# Patient Record
Sex: Female | Born: 1990 | Hispanic: Yes | Marital: Married | Smoking: Never smoker
Health system: Southern US, Community
[De-identification: ages and names within clinical notes are randomized; demographics above are authoritative.]

## PROBLEM LIST (undated history)

## (undated) DIAGNOSIS — D649 Anemia, unspecified: Secondary | ICD-10-CM

## (undated) DIAGNOSIS — O1490 Unspecified pre-eclampsia, unspecified trimester: Secondary | ICD-10-CM

## (undated) DIAGNOSIS — O139 Gestational [pregnancy-induced] hypertension without significant proteinuria, unspecified trimester: Secondary | ICD-10-CM

## (undated) DIAGNOSIS — Z87440 Personal history of urinary (tract) infections: Secondary | ICD-10-CM

## (undated) DIAGNOSIS — B999 Unspecified infectious disease: Secondary | ICD-10-CM

## (undated) DIAGNOSIS — T783XXA Angioneurotic edema, initial encounter: Secondary | ICD-10-CM

## (undated) HISTORY — DX: Personal history of urinary (tract) infections: Z87.440

## (undated) HISTORY — PX: INTRAUTERINE DEVICE (IUD) INSERTION: SHX5877

## (undated) HISTORY — DX: Anemia, unspecified: D64.9

## (undated) HISTORY — PX: APPENDECTOMY: SHX54

## (undated) HISTORY — DX: Angioneurotic edema, initial encounter: T78.3XXA

## (undated) HISTORY — PX: OVARIAN CYST REMOVAL: SHX89

## (undated) HISTORY — DX: Unspecified infectious disease: B99.9

---

## 2019-05-14 ENCOUNTER — Other Ambulatory Visit: Payer: Self-pay

## 2019-05-14 ENCOUNTER — Emergency Department
Admission: EM | Admit: 2019-05-14 | Discharge: 2019-05-15 | Disposition: A | Discharge status: Home / Self Care | Payer: Self-pay | Attending: Emergency Medicine | Admitting: Emergency Medicine

## 2019-05-14 ENCOUNTER — Emergency Department: Payer: Self-pay

## 2019-05-14 ENCOUNTER — Encounter: Payer: Self-pay | Admitting: Emergency Medicine

## 2019-05-14 DIAGNOSIS — Z3A01 Less than 8 weeks gestation of pregnancy: Secondary | ICD-10-CM | POA: Insufficient documentation

## 2019-05-14 DIAGNOSIS — R102 Pelvic and perineal pain: Secondary | ICD-10-CM | POA: Insufficient documentation

## 2019-05-14 DIAGNOSIS — O4690 Antepartum hemorrhage, unspecified, unspecified trimester: Secondary | ICD-10-CM

## 2019-05-14 DIAGNOSIS — O26851 Spotting complicating pregnancy, first trimester: Secondary | ICD-10-CM | POA: Insufficient documentation

## 2019-05-14 HISTORY — DX: Unspecified pre-eclampsia, unspecified trimester: O14.90

## 2019-05-14 LAB — LIPASE, BLOOD: Lipase: 21 U/L (ref 11–51)

## 2019-05-14 LAB — CBC
HCT: 36.9 % (ref 36.0–46.0)
Hemoglobin: 12.7 g/dL (ref 12.0–15.0)
MCH: 29.3 pg (ref 26.0–34.0)
MCHC: 34.4 g/dL (ref 30.0–36.0)
MCV: 85 fL (ref 80.0–100.0)
Platelets: 275 10*3/uL (ref 150–400)
RBC: 4.34 MIL/uL (ref 3.87–5.11)
RDW: 12.7 % (ref 11.5–15.5)
WBC: 8.4 10*3/uL (ref 4.0–10.5)
nRBC: 0 % (ref 0.0–0.2)

## 2019-05-14 LAB — COMPREHENSIVE METABOLIC PANEL
ALT: 33 U/L (ref 0–44)
AST: 27 U/L (ref 15–41)
Albumin: 3.8 g/dL (ref 3.5–5.0)
Alkaline Phosphatase: 44 U/L (ref 38–126)
Anion gap: 9 (ref 5–15)
BUN: 15 mg/dL (ref 6–20)
CO2: 23 mmol/L (ref 22–32)
Calcium: 9.1 mg/dL (ref 8.9–10.3)
Chloride: 104 mmol/L (ref 98–111)
Creatinine, Ser: 0.52 mg/dL (ref 0.44–1.00)
GFR calc Af Amer: >60 mL/min (ref >60)
GFR calc non Af Amer: >60 mL/min (ref >60)
Glucose, Bld: 97 mg/dL (ref 70–99)
Potassium: 3.5 mmol/L (ref 3.5–5.1)
Sodium: 136 mmol/L (ref 135–145)
Total Bilirubin: 0.4 mg/dL (ref 0.3–1.2)
Total Protein: 7.5 g/dL (ref 6.5–8.1)

## 2019-05-14 LAB — URINALYSIS, COMPLETE (UACMP) WITH MICROSCOPIC
Bacteria, UA: NONE SEEN
Bilirubin Urine: NEGATIVE
Glucose, UA: NEGATIVE mg/dL
Hgb urine dipstick: NEGATIVE
Ketones, ur: NEGATIVE mg/dL
Leukocytes,Ua: NEGATIVE
Nitrite: NEGATIVE
Protein, ur: NEGATIVE mg/dL
Specific Gravity, Urine: 1.028 (ref 1.005–1.030)
pH: 5 (ref 5.0–8.0)

## 2019-05-14 LAB — HCG, QUANTITATIVE, PREGNANCY: hCG, Beta Chain, Quant, S: 43713 m[IU]/mL — ABNORMAL HIGH (ref <5)

## 2019-05-14 LAB — POCT PREGNANCY, URINE: Preg Test, Ur: POSITIVE — AB

## 2019-05-14 LAB — ABO/RH: ABO/RH(D): O POS

## 2019-05-14 IMAGING — US US OB < 14 WEEKS - US OB TV
1 series · 14 of 28 positions shown · non-contrast
Comparison: None.

CLINICAL DATA: Pelvic pain and vaginal bleeding with positive
pregnancy test

EXAM:
OBSTETRIC <14 WK US AND TRANSVAGINAL OB US
TECHNIQUE: Both transabdominal and transvaginal ultrasound examinations were
performed for complete evaluation of the gestation as well as the
maternal uterus, adnexal regions, and pelvic cul-de-sac.
Transvaginal technique was performed to assess early pregnancy.

[Series 1: us ob less than 14 weeks with ob transvaginal · 14 of 98 slices shown]
[im 4/98]
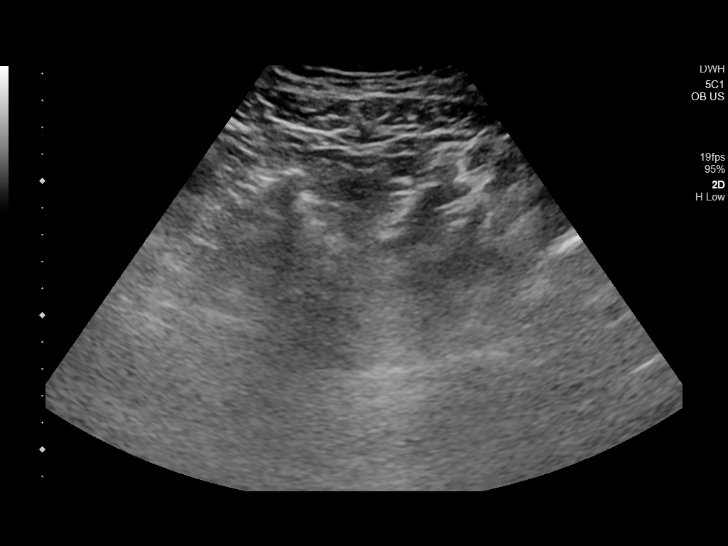
[im 11/98]
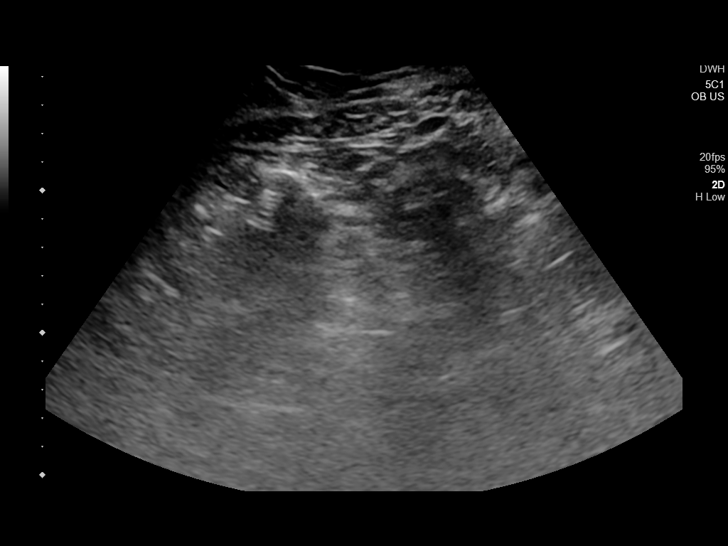
[im 18/98]
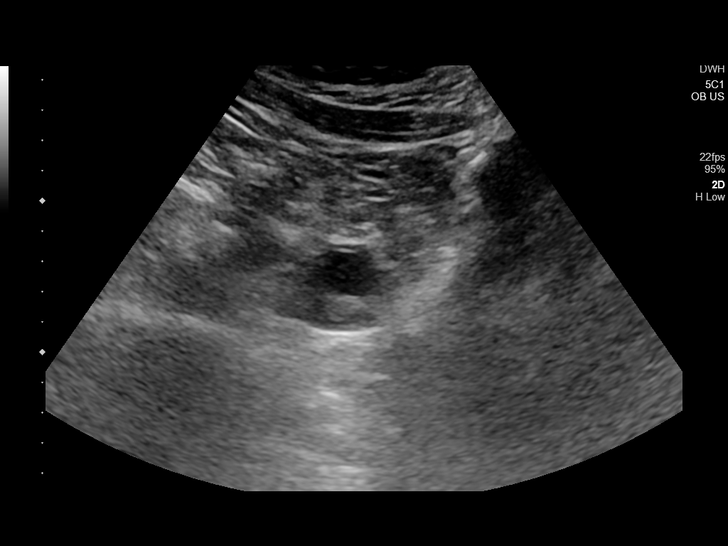
[im 26/98]
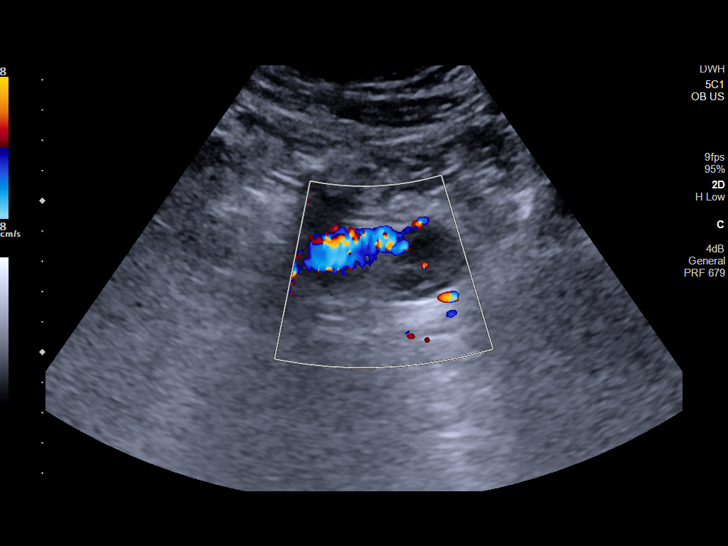
[im 33/98]
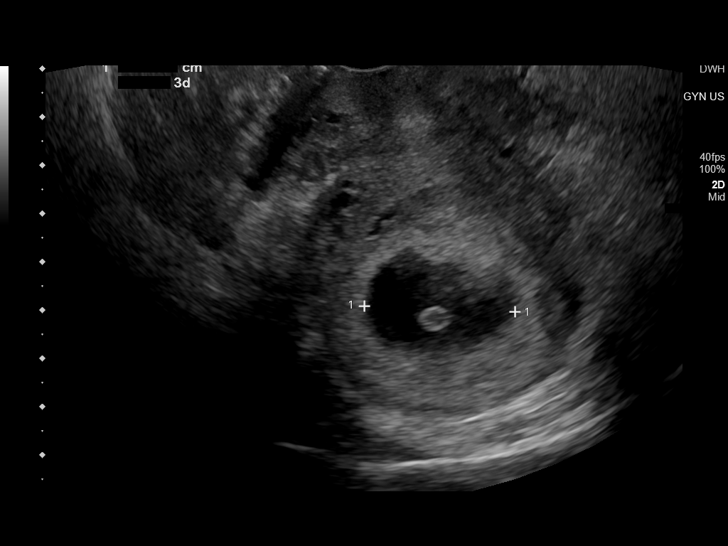
[im 40/98]
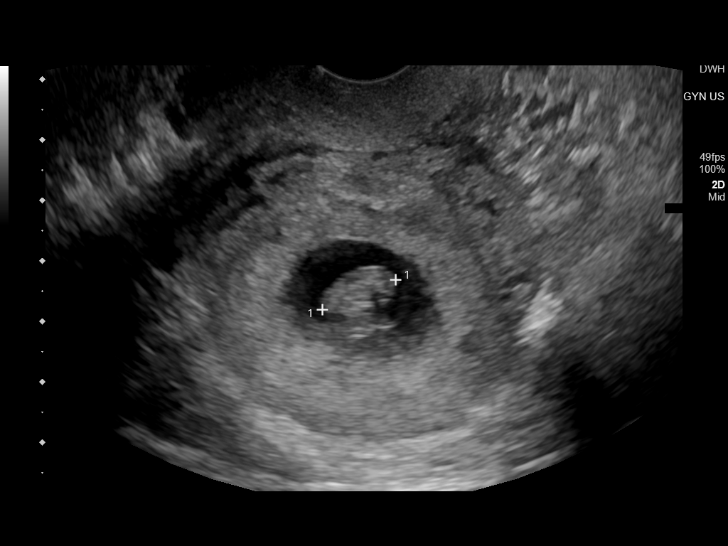
[im 47/98]
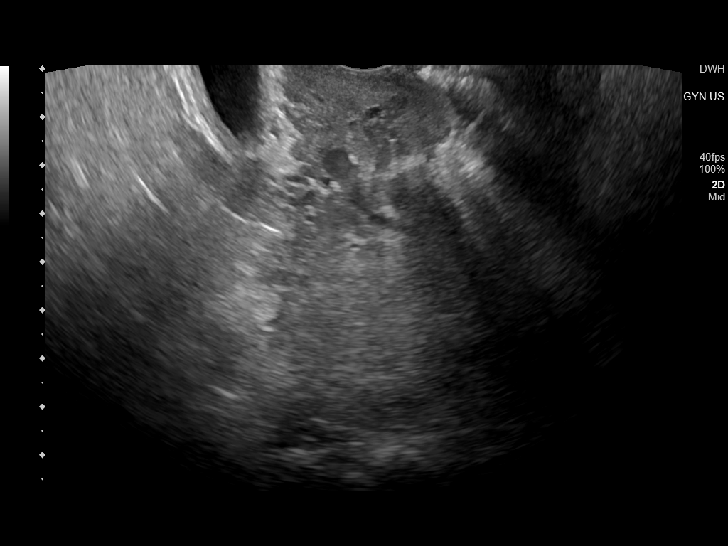
[im 54/98]
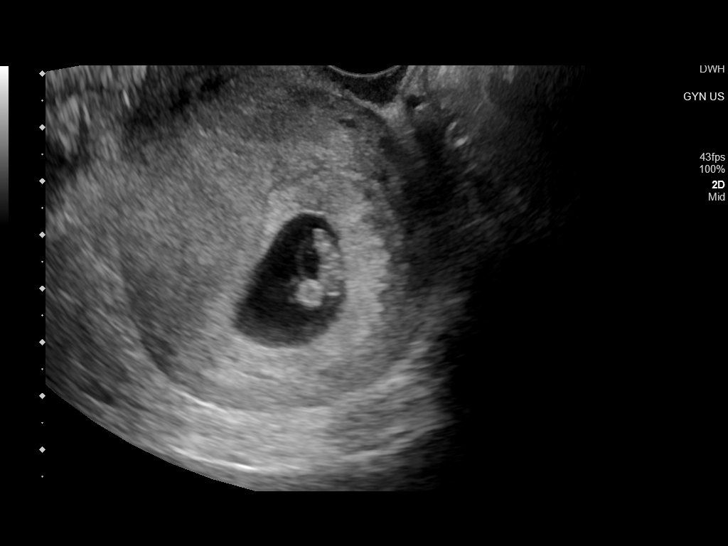
[im 62/98]
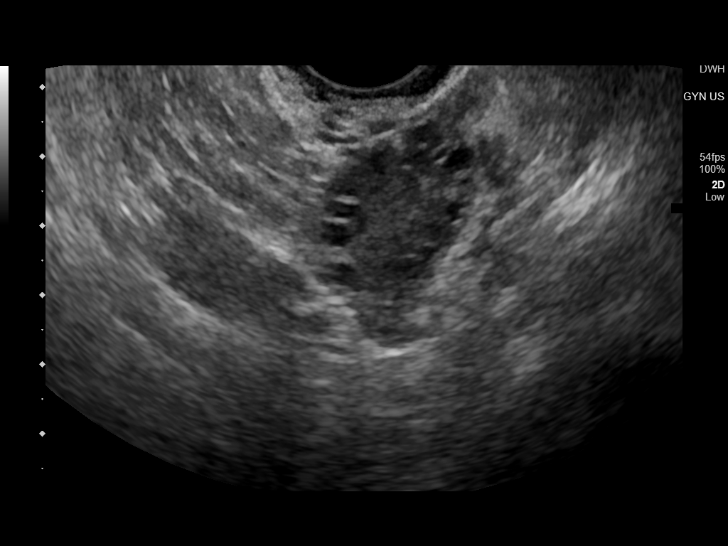
[im 69/98]
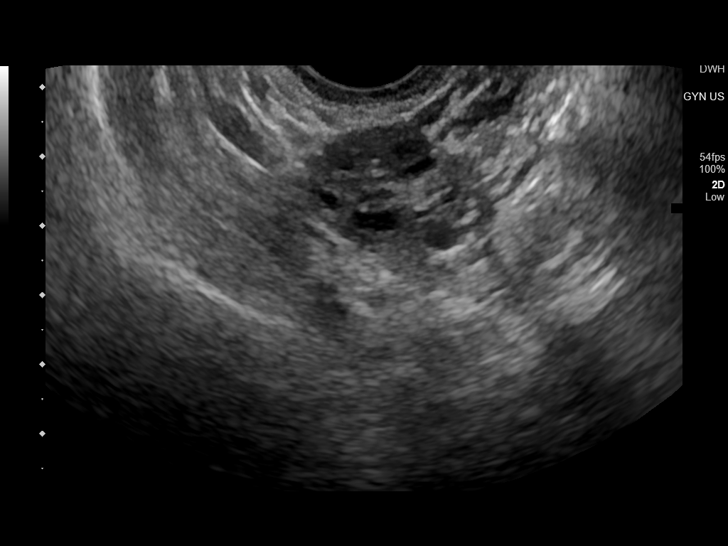
[im 76/98]
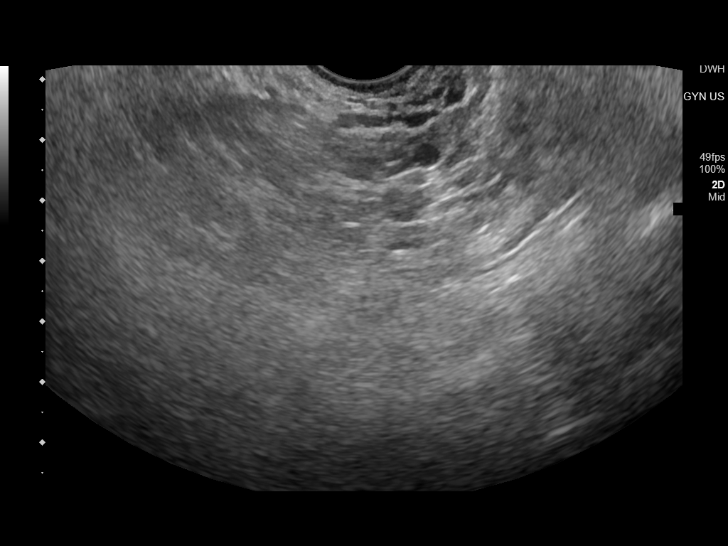
[im 83/98]
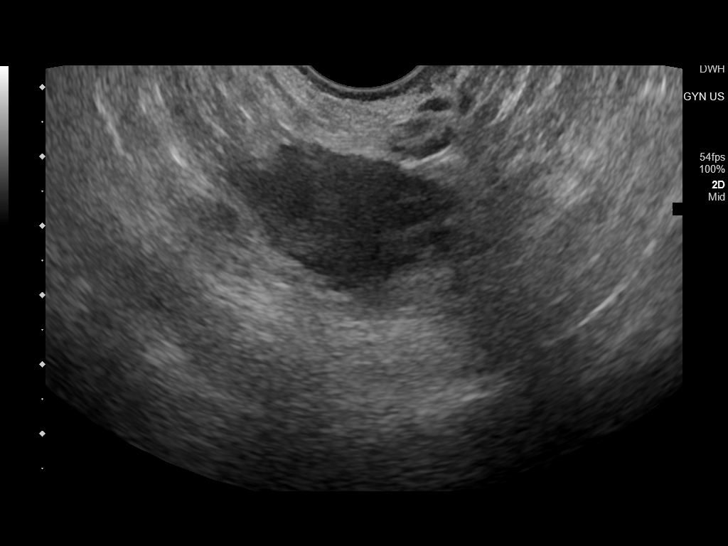
[im 90/98]
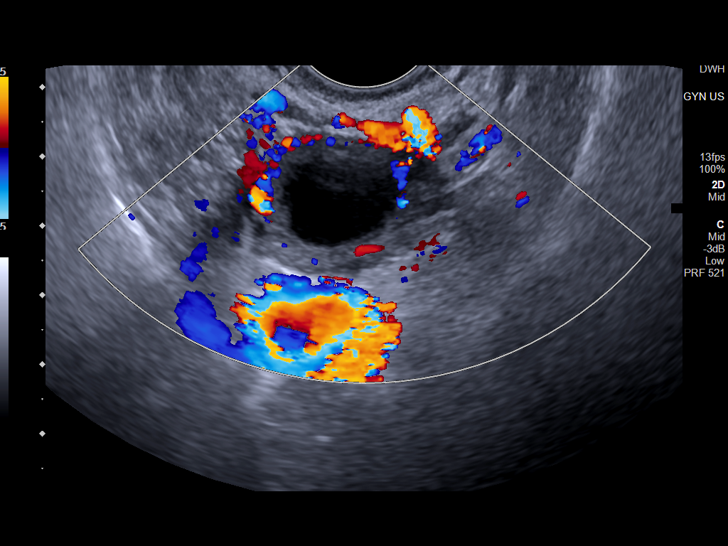
[im 98/98]
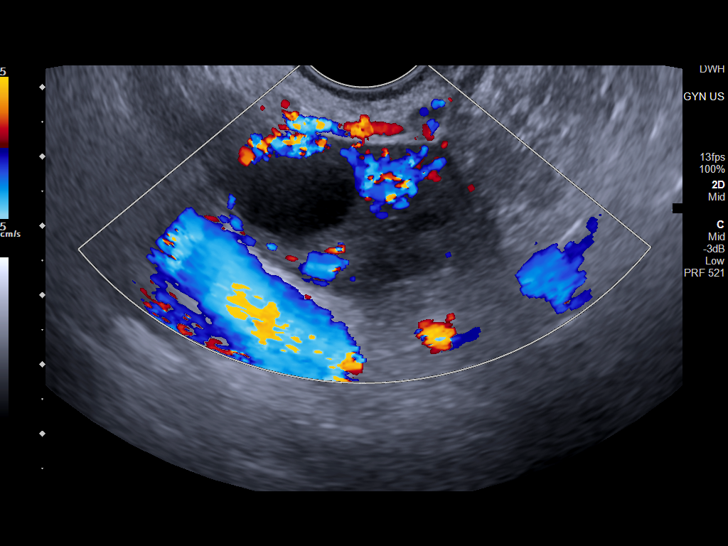

[14 of 28 positions shown; findings below may reference images not displayed]

FINDINGS: Intrauterine gestational sac: Present

Yolk sac:  Present

Embryo:  Present

Cardiac Activity: Present

Heart Rate: 147 bpm

CRL:  12.8 mm   7 w   4 d                  US EDC: [DATE]

Subchorionic hemorrhage:  None visualized.

Maternal uterus/adnexae: Within normal limits. Corpus luteum cyst is
noted on the left measuring 1.9 cm. Minimal free fluid is noted in
the pelvis likely physiologic in nature.
IMPRESSION: Single live intrauterine gestation at 7 weeks 4 days. No acute
abnormality noted.

## 2019-05-14 NOTE — ED Notes (Signed)
Pt to u/s via w/c accomp by u/s tech 

## 2019-05-14 NOTE — ED Notes (Signed)
Pt yet to return from [place].

## 2019-05-14 NOTE — ED Triage Notes (Addendum)
Pt arrived via POV with reports of back pain and pelvic pain x 2 weeks , pt states she took a pregnancy test 2 weeks ago that was positive.  Pt reports some vaginal bleeding with wiping.  G-2 P-1  Pt has hx of pre-eclampsia with previous pregnancy.  Pt requires spanish interpreter.

## 2019-05-14 NOTE — ED Provider Notes (Signed)
[facility] Emergency Department Provider Note  ____________________________________________   First MD Initiated Contact with Patient 05/14/19 2357     (approximate)  I have reviewed the triage vital signs and the nursing notes.   HISTORY  Chief Complaint Vaginal Bleeding, Pelvic Pain, and Back Pain  History obtained via [employer] Spanish interpretor  HPI Barbara Noble is a 29 y.o. female G2P1 with positive UPT at home who presents with a 2 week history of pelvic cramping. Reports light vaginal spotting during this time period as well; no bleeding currently. Unsure of dates but LMP around 4 months ago. Last sexual intercourse before onset of cramping. Denies vaginal discharge. Denies fever, cough, chest pain, sob, nausea, vomiting or dysuria.       Past Medical History:  Diagnosis Date  . Pre-eclampsia     There are no problems to display for this patient.    Prior to Admission medications   Not on File    Allergies Patient has no known allergies.  No family history on file.  Social History Social History   Tobacco Use  . Smoking status: Never Smoker  . Smokeless tobacco: Never Used  Substance Use Topics  . Alcohol use: Not Currently  . Drug use: Not on file    Review of Systems  Constitutional: No fever/chills Eyes: No visual changes. ENT: No sore throat. Cardiovascular: Denies chest pain. Respiratory: Denies shortness of breath. Gastrointestinal: Positive for pelvic cramping. No abdominal pain.  No nausea, no vomiting.  No diarrhea.  No constipation. Genitourinary: Negative for dysuria. Musculoskeletal: Negative for back pain. Skin: Negative for rash. Neurological: Negative for headaches, focal weakness or numbness.   ____________________________________________   PHYSICAL EXAM:  VITAL SIGNS: ED Triage Vitals  Enc Vitals Group     BP 05/14/19 2053 (!) 141/96     Pulse Rate 05/14/19 2053 (!) 108     Resp  05/14/19 2053 18     Temp 05/14/19 2053 98.2 F (36.8 C)     Temp Source 05/14/19 2053 Oral     SpO2 05/14/19 2053 97 %     Weight 05/14/19 2047 176 lb 5.9 oz (80 kg)     Height 05/14/19 2047 5' 10.08" (1.78 m)     Head Circumference --      Peak Flow --      Pain Score 05/14/19 2047 8     Pain Loc --      Pain Edu? --      Excl. in [place]? --     Constitutional: Alert and oriented. Well appearing and in no acute distress. Eyes: Conjunctivae are normal. PERRL. EOMI. Head: Atraumatic. Nose: No congestion/rhinnorhea. Mouth/Throat: Mucous membranes are moist.  Oropharynx non-erythematous. Neck: No stridor.   Cardiovascular: Normal rate, regular rhythm. Grossly normal heart sounds.  Good peripheral circulation. Respiratory: Normal respiratory effort.  No retractions. Lungs CTAB. Gastrointestinal: Soft and nontender to light or deep palpation. No distention. No abdominal bruits. No CVA tenderness. Musculoskeletal: No lower extremity tenderness nor edema.  No joint effusions. Neurologic:  Normal speech and language. No gross focal neurologic deficits are appreciated. No gait instability. Skin:  Skin is warm, dry and intact. No rash noted. Psychiatric: Mood and affect are normal. Speech and behavior are normal.  ____________________________________________   LABS (all labs ordered are listed, but only abnormal results are displayed)  Labs Reviewed  WET PREP, GENITAL - Abnormal; Notable for the following components:      Result Value   WBC,  Wet Prep HPF POC MODERATE (*)    All other components within normal limits  URINALYSIS, COMPLETE (UACMP) WITH MICROSCOPIC - Abnormal; Notable for the following components:   Color, Urine YELLOW (*)    APPearance CLEAR (*)    All other components within normal limits  HCG, QUANTITATIVE, PREGNANCY - Abnormal; Notable for the following components:   hCG, Beta Chain, Quant, S 42,595 (*)    All other components within normal limits  POCT PREGNANCY,  URINE - Abnormal; Notable for the following components:   Preg Test, Ur POSITIVE (*)    All other components within normal limits  CHLAMYDIA/NGC RT PCR ([facility] ONLY)  LIPASE, BLOOD  COMPREHENSIVE METABOLIC PANEL  CBC  ABO/RH   ____________________________________________  EKG  None ____________________________________________  RADIOLOGY  ED MD interpretation:  IUP [redacted]w[redacted]d; left corpus luteum cyst  Official radiology report(s): US OB LESS THAN 14 WEEKS WITH OB TRANSVAGINAL  Result Date: 05/15/2019 CLINICAL DATA:  Pelvic pain and vaginal bleeding with positive pregnancy test EXAM: OBSTETRIC <14 WK [place] AND TRANSVAGINAL OB US TECHNIQUE: Both transabdominal and transvaginal ultrasound examinations were performed for complete evaluation of the gestation as well as the maternal uterus, adnexal regions, and pelvic cul-de-sac. Transvaginal technique was performed to assess early pregnancy. COMPARISON:  None. FINDINGS: Intrauterine gestational sac: Present Yolk sac:  Present Embryo:  Present Cardiac Activity: Present Heart Rate: 147 bpm CRL:  12.8 mm   7 w   4 d                  [place] EDC: 12/27/2019 Subchorionic hemorrhage:  None visualized. Maternal uterus/adnexae: Within normal limits. Corpus luteum cyst is noted on the left measuring 1.9 cm. Minimal free fluid is noted in the pelvis likely physiologic in nature. IMPRESSION: Single live intrauterine gestation at 7 weeks 4 days. No acute abnormality noted. Electronically Signed   By: Alcide Clever M.D.   On: 05/15/2019 00:28    ____________________________________________   PROCEDURES  Procedure(s) performed (including Critical Care):  Procedures   ____________________________________________   INITIAL IMPRESSION / ASSESSMENT AND PLAN / ED COURSE  As part of my medical decision making, I reviewed the following data within the electronic MEDICAL RECORD NUMBER Nursing notes reviewed and incorporated, Labs reviewed, Old chart reviewed,  Radiograph reviewed and Notes from prior ED visits     [name] was evaluated in Emergency Department on 05/15/2019 for the symptoms described in the history of present illness. She was evaluated in the context of the global COVID-19 pandemic, which necessitated consideration that the patient might be at risk for infection with the SARS-CoV-2 virus that causes COVID-19. Institutional protocols and algorithms that pertain to the evaluation of patients at risk for COVID-19 are in a state of rapid change based on information released by regulatory bodies including the CDC and federal and state organizations. These policies and algorithms were followed during the patient's care in the ED.    29 year old G2P1 presenting with vaginal spotting last week and pelvic cramping. Differential diagnosis includes, but is not limited to, ovarian cyst, ovarian torsion, acute appendicitis, diverticulitis, urinary tract infection/pyelonephritis, endometriosis, bowel obstruction, colitis, renal colic, gastroenteritis, hernia, fibroids, endometriosis, pregnancy related pain including ectopic pregnancy, etc.  Laboratory and urinalysis results unremarkable. Patient will self-swab for wet prep and DNA. Awaiting [place] results.   Clinical Course as of May 14 120  Wed May 15, 2019  0120 Updated patient on [place] results. Strict return precautions given. Patient verbalizes understanding and agrees  with plan of care.   [JS]    Clinical Course User Index [JS] Irean Hong, MD     ____________________________________________   FINAL CLINICAL IMPRESSION(S) / ED DIAGNOSES  Final diagnoses:  Pelvic pain in female  Less than [redacted] weeks gestation of pregnancy     ED Discharge Orders    None       Note:  This document was prepared using Dragon voice recognition software and may include unintentional dictation errors.   Irean Hong, MD 05/15/19 [phone]

## 2019-05-15 ENCOUNTER — Encounter: Payer: Self-pay | Admitting: Radiology

## 2019-05-15 LAB — CHLAMYDIA/NGC RT PCR (ARMC ONLY)
Chlamydia Tr: NOT DETECTED
N gonorrhoeae: NOT DETECTED

## 2019-05-15 LAB — WET PREP, GENITAL
Clue Cells Wet Prep HPF POC: NONE SEEN
Sperm: NONE SEEN
Trich, Wet Prep: NONE SEEN
Yeast Wet Prep HPF POC: NONE SEEN

## 2019-05-15 NOTE — ED Notes (Signed)
Assessment done via interpreter. Pt states pain started 15 days ago with d/c. Blood has stopped. Pt last sexual encounter 20 days ago. G2 P1

## 2019-05-15 NOTE — Discharge Instructions (Addendum)
Practice pelvic rest until you see your doctor. Return to the ER for worsening symptoms, persistent vomiting, difficulty breathing or other concerns.

## 2019-07-30 ENCOUNTER — Encounter (HOSPITAL_COMMUNITY): Payer: Self-pay | Admitting: Emergency Medicine

## 2019-07-30 ENCOUNTER — Other Ambulatory Visit: Payer: Self-pay

## 2019-07-30 ENCOUNTER — Inpatient Hospital Stay (HOSPITAL_COMMUNITY)
Admission: AD | Admit: 2019-07-30 | Discharge: 2019-07-30 | Disposition: A | Discharge status: Home / Self Care | Payer: 59 | Attending: Family Medicine | Admitting: Family Medicine

## 2019-07-30 DIAGNOSIS — Z8759 Personal history of other complications of pregnancy, childbirth and the puerperium: Secondary | ICD-10-CM | POA: Insufficient documentation

## 2019-07-30 DIAGNOSIS — R519 Headache, unspecified: Secondary | ICD-10-CM | POA: Diagnosis not present

## 2019-07-30 DIAGNOSIS — Z7982 Long term (current) use of aspirin: Secondary | ICD-10-CM | POA: Diagnosis not present

## 2019-07-30 DIAGNOSIS — Z3A18 18 weeks gestation of pregnancy: Secondary | ICD-10-CM | POA: Diagnosis not present

## 2019-07-30 DIAGNOSIS — O26892 Other specified pregnancy related conditions, second trimester: Secondary | ICD-10-CM | POA: Diagnosis not present

## 2019-07-30 DIAGNOSIS — R102 Pelvic and perineal pain: Secondary | ICD-10-CM | POA: Insufficient documentation

## 2019-07-30 DIAGNOSIS — Z79899 Other long term (current) drug therapy: Secondary | ICD-10-CM | POA: Insufficient documentation

## 2019-07-30 HISTORY — DX: Gestational (pregnancy-induced) hypertension without significant proteinuria, unspecified trimester: O13.9

## 2019-07-30 LAB — URINALYSIS, ROUTINE W REFLEX MICROSCOPIC
Bilirubin Urine: NEGATIVE
Glucose, UA: NEGATIVE mg/dL
Hgb urine dipstick: NEGATIVE
Ketones, ur: NEGATIVE mg/dL
Nitrite: NEGATIVE
Protein, ur: 30 mg/dL — AB
Specific Gravity, Urine: 1.032 — ABNORMAL HIGH (ref 1.005–1.030)
pH: 5 (ref 5.0–8.0)

## 2019-07-30 MED ORDER — OXYCODONE HCL 5 MG PO TABS
5.0000 mg | ORAL_TABLET | Freq: Once | ORAL | Status: AC
  Administered 2019-07-30: 5 mg via ORAL
  Filled 2019-07-30: qty 1

## 2019-07-30 MED ORDER — ACETAMINOPHEN 500 MG PO TABS
1000.0000 mg | ORAL_TABLET | Freq: Once | ORAL | Status: AC
  Administered 2019-07-30: 1000 mg via ORAL
  Filled 2019-07-30: qty 2

## 2019-07-30 MED ORDER — CYCLOBENZAPRINE HCL 5 MG PO TABS
10.0000 mg | ORAL_TABLET | Freq: Once | ORAL | Status: AC
  Administered 2019-07-30: 10 mg via ORAL
  Filled 2019-07-30: qty 2

## 2019-07-30 MED ORDER — METOCLOPRAMIDE HCL 5 MG/ML IJ SOLN: 5.0000 mg | Freq: Once | INTRAMUSCULAR | Status: DC

## 2019-07-30 MED ORDER — LACTATED RINGERS IV SOLN: INTRAVENOUS | Status: DC

## 2019-07-30 MED ORDER — DEXAMETHASONE SODIUM PHOSPHATE 10 MG/ML IJ SOLN: 8.0000 mg | Freq: Once | INTRAMUSCULAR | Status: DC

## 2019-07-30 MED ORDER — METOCLOPRAMIDE HCL 10 MG PO TABS
5.0000 mg | ORAL_TABLET | Freq: Once | ORAL | Status: AC
  Administered 2019-07-30: 5 mg via ORAL
  Filled 2019-07-30: qty 1

## 2019-07-30 MED ORDER — DIPHENHYDRAMINE HCL 50 MG/ML IJ SOLN: 12.5000 mg | Freq: Once | INTRAMUSCULAR | Status: DC

## 2019-07-30 NOTE — Discharge Instructions (Signed)
Dolor de cabeza general sin causa General Headache Without Cause El dolor de cabeza es un dolor o malestar que se siente en la zona de la cabeza o del cuello. Hay muchas causas y tipos de dolores de cabeza. En algunos casos, es posible que no se encuentre la causa. Siga estas indicaciones en su casa: Controle su afeccin para detectar cualquier cambio. Infrmele a su mdico acerca de los cambios. Siga estos pasos para controlar su afeccin: Control del dolor      Tome los medicamentos de venta libre y los recetados solamente como se lo haya indicado el mdico.  Cuando sienta dolor de cabeza acustese en un cuarto oscuro y tranquilo.  Si se lo indican, aplquese hielo en la cabeza y en la zona del cuello: ? Ponga el hielo en una bolsa plstica. ? Coloque una toalla entre la piel y la bolsa. ? Coloque el hielo durante 20minutos, 2a3veces al da.  Si se lo indican, aplique calor en la zona afectada. Use la fuente de calor que el mdico le recomiende, como una compresa de calor hmedo o una almohadilla trmica. ? Coloque una toalla entre la piel y la fuente de calor. ? Aplique calor durante 20 a 30minutos. ? Retire la fuente de calor si la piel se pone de color rojo brillante. Esto es muy importante si no puede sentir dolor, calor o fro. Puede correr un riesgo mayor de sufrir quemaduras.  Mantenga las luces tenues si las luces brillantes le molestan o sus dolores de cabeza empeoran. Comida y bebida  Mantenga un horario para las comidas.  Si bebe alcohol: ? Limite la cantidad que bebe a lo siguiente:  De 0 a 1 medida por da para las mujeres.  De 0 a 2 medidas por da para los hombres. ? Est atento a la cantidad de alcohol que hay en las bebidas que toma. En [place], una medida equivale a una botella de cerveza de 12oz (355ml), un vaso de vino de 5oz (148ml) o un vaso de una bebida alcohlica de alta graduacin de 1oz (44ml).  Deje de tomar cafena o reduzca  la cantidad que consume. Indicaciones generales   Lleve un registro diario para averiguar si ciertas cosas provocan los dolores de cabeza. Registre, por ejemplo, lo siguiente: ? Lo que usted come y bebe. ? El tiempo que duerme. ? Algn cambio en su dieta o en los medicamentos.  Hgase masajes o pruebe otras formas de relajarse.  Limite el estrs.  Sintese con la espalda recta. No contraiga (tensione) los msculos.  No consuma ningn producto que contenga nicotina o tabaco. Estos incluyen los cigarrillos, el tabaco para mascar y los cigarrillos electrnicos. Si necesita ayuda para dejar de fumar, consulte al mdico.  Haga ejercicios con regularidad tal como se lo indic el mdico.  Duerma lo suficiente. Esto a menudo significa entre 7 y 9horas de sueo cada noche.  Concurra a todas las visitas de control como se lo haya indicado el mdico. Esto es importante. Comunquese con un mdico si:  Los medicamentos no logran aliviar los sntomas.  Tiene un dolor de cabeza que es diferente a los otros dolores de cabeza.  Tiene malestar estomacal (nuseas) o vomita.  Tiene fiebre. Solicite ayuda inmediatamente si:  El dolor de cabeza empeora rpidamente.  El dolor empeora despus de hacer mucha actividad fsica.  Sigue vomitando.  Presenta rigidez en el cuello.  Tiene dificultad para ver.  Tiene dificultad para hablar.  Siente dolor en el ojo   o en el odo.  Sus msculos estn dbiles, o pierde el control muscular.  Pierde el equilibrio o tiene problemas para caminar.  Siente que va a desvanecerse (perder el conocimiento) o se desmaya.  Est desorientado (confundido).  Tiene una convulsin. Resumen  El dolor de cabeza es un dolor o malestar que se siente en la zona de la cabeza o del cuello.  Hay muchas causas y tipos de dolores de cabeza. En algunos casos, es posible que no se encuentre la causa.  Lleve un diario como ayuda para determinar la causa de los dolores  de cabeza. Controle su afeccin para detectar cualquier cambio. Infrmele a su mdico acerca de los cambios.  Comunquese con un mdico si tiene un dolor de cabeza que es diferente de lo habitual o si el dolor de cabeza no se alivia con los medicamentos.  Solicite ayuda de inmediato si el dolor de cabeza es muy intenso, vomita, tiene dificultad para ver, pierde el equilibrio o tiene una convulsin. Esta informacin no tiene como fin reemplazar el consejo del mdico. Asegrese de hacerle al mdico cualquier pregunta que tenga. Document Revised: 10/04/2017 Document Reviewed: 10/04/2017 Elsevier Patient Education  2020 Elsevier Inc.  

## 2019-07-30 NOTE — MAU Provider Note (Addendum)
Chief Complaint: Pelvic Pain and Headache   First Provider Initiated Contact with Patient 07/30/19 1917     SUBJECTIVE HPI: Barbara Noble is a 29 y.o. G2P1001 at [redacted]w[redacted]d who presents to [facility] reporting headache & pelvic pain.  Reports headache intermittently for the last week. Gradual onset of sharp headache in frontal area. Has been taking tylenol (unsure of dosage) without relief. Sound makes headache worse. Occasionally feels nauseated with pain.  Also reports pelvic pain & right hip pain. Pain was constant yesterday. Worse when stretching or lying on her right side.  Denies fever/chills, dysuria, vaginal bleeding, or LOF.   Location: head Quality: sharp, aching Severity: 8/10 on pain scale Duration: 1 week Timing: comes & goes Modifying factors: worse with sounds Associated signs and symptoms: none  Past Medical History:  Diagnosis Date  . Pre-eclampsia   . Pregnancy induced hypertension    OB History  Gravida Para Term Preterm AB Living  2 1 1     1   SAB TAB Ectopic Multiple Live Births               # Outcome Date GA Lbr Len/2nd Weight Sex Delivery Anes PTL Lv  2 Current           1 Term 07/15/15     Vag-Forceps      Past Surgical History:  Procedure Laterality Date  . APPENDECTOMY    . OVARIAN CYST REMOVAL     Social History   Socioeconomic History  . Marital status: Married    Spouse name: Not on file  . Number of children: Not on file  . Years of education: Not on file  . Highest education level: Not on file  Occupational History  . Not on file  Tobacco Use  . Smoking status: Never Smoker  . Smokeless tobacco: Never Used  Vaping Use  . Vaping Use: Never used  Substance and Sexual Activity  . Alcohol use: Not Currently  . Drug use: Never  . Sexual activity: Yes  Other Topics Concern  . Not on file  Social History Narrative  . Not on file   Social Determinants of Health   Financial Resource Strain:   . Difficulty of Paying  Living Expenses:   Food Insecurity:   . Worried About 07/17/15 in the Last Year:   . Programme researcher, broadcasting/film/video in the Last Year:   Transportation Needs:   . Barista (Medical):   Freight forwarder Lack of Transportation (Non-Medical):   Physical Activity:   . Days of Exercise per Week:   . Minutes of Exercise per Session:   Stress:   . Feeling of Stress :   Social Connections:   . Frequency of Communication with Friends and Family:   . Frequency of Social Gatherings with Friends and Family:   . Attends Religious Services:   . Active Member of Clubs or Organizations:   . Attends [employer] Meetings:   Banker Marital Status:   Intimate Partner Violence:   . Fear of Current or Ex-Partner:   . Emotionally Abused:   [employer] Physically Abused:   . Sexually Abused:    Family History  Problem Relation Age of Onset  . Diabetes Mother   . Hypertension Mother   . Healthy Father    No current facility-administered medications on file prior to encounter.   Current Outpatient Medications on File Prior to Encounter  Medication Sig Dispense Refill  . aspirin 81 MG  EC tablet Take by mouth.    . Prenatal Vit-Fe Fumarate-FA (PRENATAL MULTIVITAMIN) TABS tablet Take 1 tablet by mouth daily at 12 noon.    Marland Kitchen acetaminophen (TYLENOL) 500 MG tablet Take by mouth.    . ondansetron (ZOFRAN-ODT) 4 MG disintegrating tablet      No Known Allergies  I have reviewed patient's Past Medical Hx, Surgical Hx, Family Hx, Social Hx, medications and allergies.   Review of Systems  Constitutional: Negative.   Eyes: Negative for photophobia, pain and visual disturbance.  Gastrointestinal: Negative.   Genitourinary: Positive for pelvic pain. Negative for dysuria, vaginal bleeding and vaginal discharge.  Neurological: Positive for headaches.    OBJECTIVE Patient Vitals for the past 24 hrs:  BP Temp Temp src Pulse Resp SpO2 Weight  07/30/19 1830 123/88 98.6 F (37 C) Oral (!) 107 20 -- --  07/30/19 1816 --  -- -- -- -- -- 88.1 kg  07/30/19 1547 139/65 98.8 F (37.1 C) Oral (!) 104 16 100 % --   Constitutional: Well-developed, well-nourished female in no acute distress.  Cardiovascular: normal rate & rhythm, no murmur Respiratory: normal rate and effort. Lung sounds clear throughout GI: Abd soft, non-tender, Pos BS x 4. No guarding or rebound tenderness MS: Extremities nontender, no edema, normal ROM Neurologic: Alert and oriented x 4.  GU:  Cervix closed/thick   LAB RESULTS Results for orders placed or performed during the hospital encounter of 07/30/19 (from the past 24 hour(s))  Urinalysis, Routine w reflex microscopic     Status: Abnormal   Collection Time: 07/30/19  7:11 PM  Result Value Ref Range   Color, Urine AMBER (A) YELLOW   APPearance CLOUDY (A) CLEAR   Specific Gravity, Urine 1.032 (H) 1.005 - 1.030   pH 5.0 5.0 - 8.0   Glucose, UA NEGATIVE NEGATIVE mg/dL   Hgb urine dipstick NEGATIVE NEGATIVE   Bilirubin Urine NEGATIVE NEGATIVE   Ketones, ur NEGATIVE NEGATIVE mg/dL   Protein, ur 30 (A) NEGATIVE mg/dL   Nitrite NEGATIVE NEGATIVE   Leukocytes,Ua MODERATE (A) NEGATIVE   RBC / HPF 0-5 0 - 5 RBC/hpf   WBC, UA 6-10 0 - 5 WBC/hpf   Bacteria, UA RARE (A) NONE SEEN   Squamous Epithelial / LPF 21-50 0 - 5   Mucus PRESENT    Ca Oxalate Crys, UA PRESENT     IMAGING No results found.  MAU COURSE Orders Placed This Encounter  Procedures  . Urinalysis, Routine w reflex microscopic   Meds ordered this encounter  Medications  . acetaminophen (TYLENOL) tablet 1,000 mg  . cyclobenzaprine (FLEXERIL) tablet 10 mg    MDM FHT present via doppler  Abdomen soft & non tender. Cervix closed/thick.  Discussed maternity support belt. Will give rx for belt & address for bio tech.   Tylenol & flexeril for headache. If helps will give rx for flexeril. If headaches continue or worse may consider appt with headache clinic at [facility]   Care turned over to [facility]  [facility] Jorje Guild, NP 07/30/2019  8:00 PM  -after administration of Flexeril and Tylenol, pt reports HA now still 8/10 -care transferred to Hansel Feinstein, CNM Clarisa Fling, NP  8:49 PM 07/30/2019  Assumed Care: Discussed option of treating this headache as a migraine Offered IVF, Reglan, Benadryl and Decadron Pt declined this plan because of the time involved Option of using Oxy IR and Reglan PO with po hydration given, she wants to try this  Feels much better  after treatment Wants to go home  Assessment & Plan A;  Single IUP at [redacted]w[redacted]d       Pelvic pain      Headache, likely migraine  P:  Discharge home      Precautions to return if headache returns, may need head CT      Followup in OB office Encouraged to return here or to other Urgent Care/ED if she develops worsening of symptoms, increase in pain, fever, or other concerning symptoms.    Aviva Signs, CNM

## 2019-07-30 NOTE — ED Triage Notes (Signed)
Pt presents with right-sided pelvic pain, reports she is approx. 5 months pregnant, has had no prenatal care, LMP 01/24/19. Also c/o headache, has been monitoring her BPs at home, states they fluctuate, hx pre-eclampsia with previous pregnancy.

## 2019-07-30 NOTE — ED Provider Notes (Signed)
I was asked by triage staff to evaluate this patient to see if appropriate for MAU transfer.  Chief Complaint: Pelvic pain, headache  HPI: Patient had ultrasound confirmed IUP on May 14, 2019.  By that ultrasound patient would be 18 weeks and 4 days pregnant today. Patient reports over the last 2-3 days she has developed right-sided pelvic pain.  Additionally she reports a mild persistent headache for the last 2 days which is not improved by Tylenol.  She reports symptoms feel similar to episode of preeclampsia she had with her first pregnancy.  ROS: + Pelvic pain, headache           -Fall/injury, fever/chills, vision changes, dizziness, nausea/vomiting, chest pain/shortness of breath, numbness/weakness, tingling or any additional concerns.  Physical Exam:   Gen: No distress  Neuro: Awake and Alert  Skin: Warm    Focused Exam: Abdomen nontender, Speech is clear and goal oriented, follows commands, Major Cranial nerves without deficit, no facial droop, Normal strength in upper and lower extremities bilaterally including dorsiflexion and plantar flexion, strong and equal grip strength, Sensation normal to light and sharp touch, Moves extremities without ataxia, coordination intact.   4:11 PM: Discussed case with MAU provider Erin.  Patient has been accepted to transfer to [facility].  Spanish video interpreter used during encounter.  Initiation of care has begun. The patient has been counseled on the process, plan, and necessity for staying for the completion/evaluation, and the remainder of the medical screening examination   Note: Portions of this report may have been transcribed using voice recognition software. Every effort was made to ensure accuracy; however, inadvertent computerized transcription errors may still be present.   Bill Salinas, PA-C 07/30/19 1619    Geoffery Lyons, MD 07/30/19 2259

## 2019-07-30 NOTE — MAU Note (Signed)
Presents with c/o pain in pelvis and right hip.  Reports pain inhibits her from laying  on her right side.  Also concerned because has H/A and BP's fluctuating up and down.  Reports has Hx of Pre Eclampsia.  Denies VB.

## 2019-08-01 ENCOUNTER — Encounter: Payer: Self-pay | Admitting: *Deleted

## 2019-08-08 ENCOUNTER — Encounter: Payer: 59 | Admitting: Obstetrics & Gynecology

## 2019-08-08 ENCOUNTER — Other Ambulatory Visit: Payer: Self-pay

## 2019-08-08 ENCOUNTER — Encounter: Payer: Self-pay | Admitting: Obstetrics & Gynecology

## 2019-08-08 NOTE — Progress Notes (Signed)
Patient ID: Barbara Noble, female   DOB: May 02, 1990, 29 y.o.   MRN: [id number] Patient was scheduled with Dr.Arnold today, she decided to cancel this appointment because she will be going to  -[facility] for her care, state it will be closer to her house.

## 2019-08-08 NOTE — Progress Notes (Signed)
Record abstracted per 08/07/19 phone interview Hal Hope, RN; Sharlette Dense, RN

## 2019-08-09 ENCOUNTER — Ambulatory Visit: Payer: Medicaid Other | Admitting: Family Medicine

## 2019-08-09 ENCOUNTER — Encounter: Payer: Self-pay | Admitting: Family Medicine

## 2019-08-09 VITALS — BP 125/86 | HR 97 | Temp 98.5°F | Wt 194.5 lb

## 2019-08-09 DIAGNOSIS — Z8742 Personal history of other diseases of the female genital tract: Secondary | ICD-10-CM | POA: Insufficient documentation

## 2019-08-09 DIAGNOSIS — O169 Unspecified maternal hypertension, unspecified trimester: Secondary | ICD-10-CM | POA: Diagnosis not present

## 2019-08-09 DIAGNOSIS — O0930 Supervision of pregnancy with insufficient antenatal care, unspecified trimester: Secondary | ICD-10-CM | POA: Diagnosis not present

## 2019-08-09 DIAGNOSIS — Z9889 Other specified postprocedural states: Secondary | ICD-10-CM

## 2019-08-09 DIAGNOSIS — Z8759 Personal history of other complications of pregnancy, childbirth and the puerperium: Secondary | ICD-10-CM | POA: Diagnosis not present

## 2019-08-09 DIAGNOSIS — Z3480 Encounter for supervision of other normal pregnancy, unspecified trimester: Secondary | ICD-10-CM

## 2019-08-09 LAB — URINALYSIS
Bilirubin, UA: NEGATIVE
Glucose, UA: NEGATIVE
Ketones, UA: NEGATIVE
Leukocytes,UA: NEGATIVE
Nitrite, UA: NEGATIVE
Protein,UA: NEGATIVE
RBC, UA: NEGATIVE
Specific Gravity, UA: 1.03 (ref 1.005–1.030)
Urobilinogen, Ur: 0.2 mg/dL (ref 0.2–1.0)
pH, UA: 5.5 (ref 5.0–7.5)

## 2019-08-09 NOTE — Progress Notes (Signed)
[facility] HEALTH DEPT [facility] [address] [place] [address] [phone]  INITIAL PRENATAL VISIT NOTE  Subjective:  Barbara Noble is a 29 y.o. G2P0101 at [redacted]w[redacted]d being seen today to start prenatal care at the [facility] Department.  She is currently monitored for the following issues for this high-risk pregnancy and has Supervision of other normal pregnancy, antepartum; History of pre-eclampsia; Elevated blood pressure affecting pregnancy, antepartum; History of removal of ovarian cyst; and Late prenatal care @20  wks on their problem list.   Pt presents for initial OB visit. She lives with husband, 31 yo daughter and husband's 4 friends. She moved here 8 months ago from [place]. She works part time in [place] and her husband works in Metall[employer]. She denies chronic medical issues. Taking PNV and tylenol (for HA) 1-2x/day. She is unsure of when her last pap was. She does not drink, smoke or use drugs. Her PHQ-9 score is 4, she declines counseling.  Physically she is feeling ok, though suffers from headaches for past several wks. Was seen in ER on 6/15 for HA, took oxycodone but not yet flexeril. States pain is entire front of head/forehead and/or top of head. Denies vision problems or neuro sxs. Sleeping helps. Has hx of migraines as adolescent. In last pregnancy had HAs occasionally, not this bad.   Her prior pregnancy was complicated by pre-eclampsia, dx at 6 months gestation, states she was prescribed oral BP medication at that time, then needed hospitalization until IOL at around 8 months. She states BP was normal after this pregnancy, based on readings from home BP cuff running 120s/80s. During this current pregnancy at around 12 wks she had elevated BP of 140s/80s at [facility] for which she was referred to [facility] (BP normal there) and began taking aspirin for pre-eclampsia prevention.     Contractions: Not  present. Vag. Bleeding: None.  Movement: Present. Denies leaking of fluid.   Indications for ASA therapy (per uptodate) One of the following: Previous pregnancy with preeclampsia, especially early onset and with an adverse outcome Yes Multifetal gestation No Chronic hypertension - unclear Type 1 or 2 diabetes mellitus No Chronic kidney disease No Autoimmune disease (antiphospholipid syndrome, systemic lupus erythematosus) No  Two or more of the following: Nulliparity No Obesity (body mass index >30 kg/m2) No Family history of preeclampsia in mother or sister No Age ?35 years No Sociodemographic characteristics (African American race, low socioeconomic level) Yes Personal risk factors (eg, previous pregnancy with low birth weight or small for gestational age infant, previous adverse pregnancy outcome [eg, stillbirth], interval >10 years between pregnancies) Yes   The following portions of the patient's history were reviewed and updated as appropriate: allergies, current medications, past family history, past medical history, past social history, past surgical history and problem list. Problem list updated.  Objective:   Vitals:   08/09/19 0845 08/09/19 1138  BP: 133/90 125/86  Pulse: 97   Temp: 98.5 F (36.9 C)   Weight: 194 lb 8 oz (88.2 kg)     Fetal Status: Fetal Heart Rate (bpm): 150 Fundal Height: 20 cm Movement: Present  Presentation: Undeterminable   Physical Exam Vitals and nursing note reviewed.  Constitutional:      General: She is not in acute distress.    Appearance: Normal appearance. She is well-developed.  HENT:     Head: Normocephalic and atraumatic.     Right Ear: External ear normal.     Left Ear:  External ear normal.     Nose: Nose normal. No congestion or rhinorrhea.     Mouth/Throat:     Lips: Pink.     Mouth: Mucous membranes are moist.     Dentition: Normal dentition. No dental caries.     Pharynx: Oropharynx is clear. Uvula midline.  Eyes:      General: No scleral icterus.    Conjunctiva/sclera: Conjunctivae normal.  Neck:     Thyroid: No thyroid mass or thyromegaly.  Cardiovascular:     Rate and Rhythm: Normal rate.     Pulses: Normal pulses.     Comments: Extremities are warm and well perfused Pulmonary:     Effort: Pulmonary effort is normal.     Breath sounds: Normal breath sounds.  Chest:     Breasts: Breasts are symmetrical.        Right: Normal. No mass, nipple discharge or skin change.        Left: Normal. No mass, nipple discharge or skin change.  Abdominal:     General: Abdomen is flat.     Palpations: Abdomen is soft.     Tenderness: There is no abdominal tenderness.     Comments: Gravid   Genitourinary:    General: Normal vulva.     Exam position: Lithotomy position.     Pubic Area: No rash.      Labia:        Right: No rash.        Left: No rash.      Vagina: Vaginal discharge (scant, white) present.     Cervix: No cervical motion tenderness or friability.     Uterus: Normal. Enlarged (Gravid 20 wksize). Not tender.      Adnexa: Right adnexa normal and left adnexa normal.     Rectum: Normal. No external hemorrhoid.  Musculoskeletal:     Right lower leg: No edema.     Left lower leg: No edema.  Lymphadenopathy:     Upper Body:     Right upper body: No axillary adenopathy.     Left upper body: No axillary adenopathy.  Skin:    General: Skin is warm.     Capillary Refill: Capillary refill takes less than 2 seconds.  Neurological:     Mental Status: She is alert.     Assessment and Plan:  Pregnancy: G2P0101 at [redacted]w[redacted]d  1. Supervision of other normal pregnancy, antepartum -Initial prenatal visit today.  -Pt would like quad genetic screening, getting today -Anatomy US referral placed today to [facility]. -Regarding headaches: checking hgb today, advised incr hydration, 2 ES tylenol prn, can try flexeril as prescribed by ER and can also try allergy medication such as zyrtec as she also endorses itchy  eyes. If HA severe, not relieved w/these efforts to let us know/return to ER for evaluation.  -PHQ-9 score is 4. Pt informed of counselor available if desired at any time, declines at this time. -Routine dental care recommended during pregnancy, handout of local dentists provided. -Reviewed risk factors and aspirin is indicated, pt agrees to re-start taking daily. -Pap done today - QUAD Screen [facility] Only - Prenatal Profile with Varicella(282020) - Urine Culture - HIV Antibody (routine testing w rflx) - HCV Ab w/Rflx to Verification - Chlamydia/GC NAA, Confirmation - Hemoglobinopathy evaluation -[id number] - Lead, blood (adult age 31 yrs or greater) - WET PREP FOR TRICH, YEAST, CLUE - Hemoglobin, venipuncture - Urinalysis (Urine Dip) - IGP, rfx Aptima HPV ASCU  2. Elevated blood pressure affecting  pregnancy, antepartum -Pt now has two reported elevated Bps during this pregnancy. First was "140s/80s" at around 12 wks at [facility] (unable to see this visit in EMR, though was documented as such in 06/19/10 [facility] visit) and second today at 20 wks was 133/90.  -As this puts her pregnancy at higher risk I will transfer her for remainder of her prenatal care to [facility]. Referral faxed today.  -In the meantime advised to continue checking BP at home and to go to ER if >140/90.  3. History of pre-eclampsia -Will get baseline UPCR and PIH labs.  -Pt taking 81mg  aspirin daily from 12-[redacted] wks gestation. Handout with instructions given.  -S/sx of preeclampsia discussed, advised to let know/to ER asap if present. - PIH Panel ([facility] [place]) - Protein / creatinine ratio, urine  (Spot)  4. Late prenatal care  5. History of removal of ovarian cyst     Discussed overview of care and coordination with inpatient delivery practices including [facility], [id number], [facility] and [facility].    Preterm labor symptoms and general obstetric precautions including but not limited to vaginal bleeding,  contractions, leaking of fluid and fetal movement were reviewed in detail with the patient.  Please refer to After Visit Summary for other counseling recommendations.   Return for transfer to [facility] for remainder of prenatal care.  Future Appointments  Date Time Provider Department Center  09/06/2019  8:20 AM AC-MH PROVIDER AC-MAT None    09/08/2019, PA-C

## 2019-08-09 NOTE — Progress Notes (Signed)
In for initial MH visit at 20 wks 0days. Has PNV, denies flu shot. Desires QUAD screen today. Initial labs to be drawn today. Sharlyne Pacas, RN   Wet prep reviewed, no tx indicated. Informed [facility] will call with ultrasound appointment. Referred to COVID vaccine clinc for vaccine today. Sharlyne Pacas, RN   [place] Referral faxed to [facility], confirmation received. Sharlyne Pacas, RN

## 2019-08-10 LAB — CBC/D/PLT+RPR+RH+ABO+RUB AB...
Antibody Screen: NEGATIVE
Basophils Absolute: 0 10*3/uL (ref 0.0–0.2)
Basos: 1 %
EOS (ABSOLUTE): 0.2 10*3/uL (ref 0.0–0.4)
Eos: 3 %
Hematocrit: 37.2 % (ref 34.0–46.6)
Hemoglobin: 12.5 g/dL (ref 11.1–15.9)
Hepatitis B Surface Ag: NEGATIVE
Immature Grans (Abs): 0 10*3/uL (ref 0.0–0.1)
Immature Granulocytes: 0 %
Lymphocytes Absolute: 2 10*3/uL (ref 0.7–3.1)
Lymphs: 27 %
MCH: 29.3 pg (ref 26.6–33.0)
MCHC: 33.6 g/dL (ref 31.5–35.7)
MCV: 87 fL (ref 79–97)
Monocytes Absolute: 0.4 10*3/uL (ref 0.1–0.9)
Monocytes: 6 %
Neutrophils Absolute: 4.8 10*3/uL (ref 1.4–7.0)
Neutrophils: 63 %
Platelets: 271 10*3/uL (ref 150–450)
RBC: 4.26 x10E6/uL (ref 3.77–5.28)
RDW: 13.1 % (ref 11.7–15.4)
RPR Ser Ql: NONREACTIVE
Rh Factor: POSITIVE
Rubella Antibodies, IGG: <0.9 index — ABNORMAL LOW (ref >0.99)
Varicella zoster IgG: 1209 index (ref >165)
WBC: 7.5 10*3/uL (ref 3.4–10.8)

## 2019-08-10 LAB — AST+BUN+CREAT+LD+URIC A+HGB...
AST: 23 IU/L (ref 0–40)
BUN: 8 mg/dL (ref 6–20)
Creatinine, Ser: 0.46 mg/dL — ABNORMAL LOW (ref 0.57–1.00)
GFR calc Af Amer: 153 mL/min/{1.73_m2} (ref >59)
GFR calc non Af Amer: 133 mL/min/{1.73_m2} (ref >59)
Hematocrit: 36.6 % (ref 34.0–46.6)
Hemoglobin: 12.7 g/dL (ref 11.1–15.9)
LDH: 148 IU/L (ref 119–226)
Platelets: 283 10*3/uL (ref 150–450)
Uric Acid: 3 mg/dL (ref 2.6–6.2)

## 2019-08-10 LAB — PROTEIN / CREATININE RATIO, URINE
Creatinine, Urine: 177.9 mg/dL
Protein, Ur: 14.8 mg/dL
Protein/Creat Ratio: 83 mg/g creat (ref 0–200)

## 2019-08-10 LAB — HIV ANTIBODY (ROUTINE TESTING W REFLEX): HIV Screen 4th Generation wRfx: NONREACTIVE

## 2019-08-11 LAB — URINE CULTURE

## 2019-08-11 LAB — CHLAMYDIA/GC NAA, CONFIRMATION
Chlamydia trachomatis, NAA: NEGATIVE
Neisseria gonorrhoeae, NAA: NEGATIVE

## 2019-08-12 DIAGNOSIS — O09899 Supervision of other high risk pregnancies, unspecified trimester: Secondary | ICD-10-CM | POA: Insufficient documentation

## 2019-08-12 LAB — HGB FRACTIONATION CASCADE
Hgb A2: 2.7 % (ref 1.8–3.2)
Hgb A: 97.3 % (ref 96.4–98.8)
Hgb F: 0 % (ref 0.0–2.0)
Hgb S: 0 %

## 2019-08-12 LAB — LEAD, BLOOD (ADULT >= 16 YRS): Lead-Whole Blood: <1 ug/dL (ref 0–4)

## 2019-08-12 LAB — IGP, RFX APTIMA HPV ASCU: PAP Smear Comment: 0

## 2019-08-12 LAB — HCV AB W/RFLX TO VERIFICATION: HCV Ab: <0.1 s/co ratio (ref 0.0–0.9)

## 2019-08-12 LAB — HCV INTERPRETATION

## 2019-08-13 ENCOUNTER — Telehealth: Payer: Self-pay

## 2019-08-13 NOTE — Telephone Encounter (Signed)
Client in questioning [facility] transfer--per Maximiano Coss, PA note on 08/09/19 visit, discussed due to H/O severe pre-eclampsia and having 2 elevated BP visits so far in pregnancy, was deemed need High Risk MFM care.  Client agrees to transfer Sharlette Dense, RN

## 2019-08-14 LAB — WET PREP FOR TRICH, YEAST, CLUE
Trichomonas Exam: NEGATIVE
Yeast Exam: NEGATIVE

## 2019-08-14 LAB — HEMOGLOBIN, FINGERSTICK: Hemoglobin: 12.5 g/dL (ref 11.1–15.9)

## 2019-08-26 ENCOUNTER — Encounter: Payer: Self-pay | Admitting: *Deleted

## 2019-08-26 DIAGNOSIS — O09899 Supervision of other high risk pregnancies, unspecified trimester: Secondary | ICD-10-CM

## 2019-08-30 ENCOUNTER — Encounter: Payer: Self-pay | Admitting: Family Medicine

## 2019-09-02 ENCOUNTER — Encounter: Payer: Self-pay | Admitting: Family Medicine

## 2019-09-02 DIAGNOSIS — O359XX0 Maternal care for (suspected) fetal abnormality and damage, unspecified, not applicable or unspecified: Secondary | ICD-10-CM | POA: Insufficient documentation

## 2019-09-04 LAB — SPECIMEN STATUS REPORT

## 2019-09-04 LAB — HPV APTIMA: HPV Aptima: NEGATIVE

## 2019-09-06 ENCOUNTER — Ambulatory Visit: Payer: 59

## 2019-09-23 ENCOUNTER — Encounter: Payer: Self-pay | Admitting: Family Medicine

## 2019-09-23 NOTE — Progress Notes (Signed)
Pap from 08/09/19 reviewed again due to interval return of HPV results  Negative Cytology with negative High risk HPV. Next pap in 5 years. HM modifier updated and results routed to pap coordinator.   Patient transferred maternity care to [facility].

## 2020-06-08 ENCOUNTER — Ambulatory Visit (INDEPENDENT_AMBULATORY_CARE_PROVIDER_SITE_OTHER): Payer: 59 | Admitting: Family Medicine

## 2020-06-08 ENCOUNTER — Encounter: Payer: Self-pay | Admitting: Family Medicine

## 2020-06-08 ENCOUNTER — Other Ambulatory Visit: Payer: Self-pay

## 2020-06-08 VITALS — BP 110/80 | HR 95 | Temp 98.9°F | Resp 12 | Ht 70.0 in | Wt 204.6 lb

## 2020-06-08 DIAGNOSIS — M549 Dorsalgia, unspecified: Secondary | ICD-10-CM

## 2020-06-08 DIAGNOSIS — G44209 Tension-type headache, unspecified, not intractable: Secondary | ICD-10-CM

## 2020-06-08 DIAGNOSIS — G8929 Other chronic pain: Secondary | ICD-10-CM

## 2020-06-08 DIAGNOSIS — N938 Other specified abnormal uterine and vaginal bleeding: Secondary | ICD-10-CM | POA: Diagnosis not present

## 2020-06-08 DIAGNOSIS — M5441 Lumbago with sciatica, right side: Secondary | ICD-10-CM | POA: Diagnosis not present

## 2020-06-08 DIAGNOSIS — M2550 Pain in unspecified joint: Secondary | ICD-10-CM

## 2020-06-08 MED ORDER — AMITRIPTYLINE HCL 25 MG PO TABS: 25.0000 mg | ORAL_TABLET | Freq: Every day | ORAL | 1 refills | Status: DC

## 2020-06-08 NOTE — Patient Instructions (Addendum)
A few things to remember from today's visit:   Tension headache - Plan: amitriptyline (ELAVIL) 25 MG tablet  DUB (dysfunctional uterine bleeding)  Polyarthralgia  Chronic bilateral low back pain with right-sided sciatica - Plan: amitriptyline (ELAVIL) 25 MG tablet  If you need refills please call your pharmacy. Do not use My Chart to request refills or for acute issues that need immediate attention.   Amitriptyline 1/2 tab a la hora de la cama y aumente dosis en 2 semanas si lo tolera bien.  Cefalea tensional en los adultos Tension Headache, Adult La cefalea tensional es una sensacin de dolor o presin en la cabeza. Suele sentirse Intel frente y a los lados de la cabeza. Las cefaleas tensionales pueden durar de 30 minutos a [address]. Cules son las causas? Se desconoce la causa de esta afeccin. A veces, las cefaleas tensionales se producen por el estrs, la preocupacin (ansiedad) o la depresin. Otras cosas que pueden ser causarlas son las siguientes:  Alcohol.  Exceso de cafena o abstinencia de cafena.  Resfros, gripe o infecciones de los senos paranasales.  Problemas dentales. Estos pueden Arts administrator.  Cansancio.  Mantener la cabeza y el cuello en la misma posicin durante un perodo prolongado, por ejemplo, al usar la computadora.  Fumar.  Artritis en el cuello. Cules son los signos o los sntomas?  Sensacin de presin alrededor de la cabeza.  Un dolor sordo en la cabeza.  Dolor sobre la frente y los lados de la cabeza.  Dolor a la [employer] de la cabeza, del cuello y de los hombros. Cmo se trata? Esta afeccin puede tratarse con cambios en el estilo de vida y medicamentos que ayudan a Paramedic los sntomas. Siga estas instrucciones en su casa: Control del dolor  Use los medicamentos de venta libre y los recetados solamente como se lo haya indicado el mdico.  Cuando tenga una cefalea, acustese en una  habitacin oscura y silenciosa.  Si se lo indican, pngase hielo en la cabeza y el cuello. Para hacer esto: ? Ponga el hielo en una bolsa plstica. ? Coloque una [employer] piel y Copy. ? Aplique el hielo durante , 2 o 3veces por da. ? Retire el hielo si la piel se le pone de color rojo brillante. Esto es Intel. Si no puede sentir dolor, calor o fro, tiene un mayor riesgo de que se dae la zona.  Si se lo indican, aplquese calor en la nuca. Hgalo con la frecuencia que le haya indicado el mdico. Use la fuente de calor que el mdico le recomiende, como una compresa de calor hmedo o una almohadilla trmica. ? Coloque una [employer] piel y la fuente de Airline pilot. ? Aplique calor durante 20 a . ? Retire la fuente de calor si la piel se le pone de color rojo brillante. Esto es [employer]. Si no puede sentir dolor, calor o fro, tiene un mayor riesgo de [place]. Comida y bebida  Mantenga un horario para las comidas.  Si bebe alcohol: ? Limite la cantidad que bebe a lo siguiente:  De 0 a 1 medida por da para las mujeres que no estn embarazadas.  De 0 a 2 medidas por da para los hombres. ? Sepa cunta cantidad de alcohol hay en las bebidas que toma. En los [address], una medida equivale a una botella de cerveza de 12oz ( ), un vaso de vino de 5oz ( ) o un vaso de  una bebida alcohlica de alta graduacin de 1oz (17ml).  Beba suficiente lquido para Radio producer pis (orina) de color amarillo plido.  No consuma mucha cafena ni deje de consumirla por completo. Estilo de vida  Duerma entre 7y 9horas todas las noches. O duerma la cantidad de [employer] indique el mdico.  A la hora de [place] las computadoras, los telfonos y las tabletas fuera del dormitorio.  Encuentre maneras de Museum/gallery exhibitions officer. Esto puede incluir: ? Musician. ? Respiracin profunda. ? Yoga. ? Chief Financial Officer. ? Tener pensamientos positivos.  Sintese con la espalda recta. Intentar relajar los msculos.  No fume ni consuma ningn producto que contenga nicotina o tabaco. Si necesita ayuda para dejar de consumir estos productos, consulte al mdico. Instrucciones generales  Evite las cosas que puedan provocar las cefaleas. Lleve un diario sobre las cefaleas para ver qu puede provocarlas. Registre, por ejemplo, lo siguiente: ? Lo que usted come y bebe. ? El tiempo que duerme. ? Algn cambio en su dieta o en los medicamentos.  Cumpla con todas las visitas de seguimiento.   Comunquese con un mdico si:  La cefalea no se alivia.  La cefalea regresa.  Tiene una cefalea y [employer]igroup sonidos, la luz o los olores.  Tiene ganas de vomitar o vomita.  Le duele el estmago. Solicite ayuda de inmediato si:  De repente siente un dolor de cabeza muy intenso junto con cualquiera de estas cosas: ? Rigidez en el cuello. ? Ganas de vomitar. ? Vmitos. ? Sentirse confundido (confuso). ? Sensacin de debilidad en una parte o un lado del cuerpo. ? Problemas para ver o hablar, o ambas cosas. ? Falta de aire. ? Erupcin cutnea. ? Sentirse muy somnoliento. ? Dolor en el ojo o el odo. ? Dificultad para caminar o mantener el equilibrio. ? Sensacin de que va a [place], o se desmaya. Resumen  La cefalea tensional es un dolor o una presin que se siente en la cabeza.  Las cefaleas tensionales pueden durar de 30 minutos a [address].  Los [employer] en el estilo de vida y los medicamentos pueden ayudar a Engineer, materials. Esta informacin no tiene Theme park manager el consejo del mdico. Asegrese de hacerle al mdico cualquier pregunta que tenga. Document Revised: 12/10/2019 Document Reviewed: 12/10/2019 Elsevier Patient Education  2021 Elsevier Inc.   Please be sure medication list is accurate. If a new problem present, please set up appointment sooner than planned  today.

## 2020-06-08 NOTE — Progress Notes (Addendum)
HPI:   Barbara Noble is a 30 y.o. female, who is here today with her husband and 2 children to establish care.  Former PCP: N/A Last preventive routine visit: Over a year ago.  Chronic medical problems: Hyperlipidemia, migraines, gestational diabetes, and preeclampsia.   Concerns today: Headaches, lower back pain, vaginal spotting, and joint pain.  Over 6 months of bilateral lower back pain radiated to right lower extremity. Negative for numbness and tingling. Negative for saddle anesthesia and bladder/bowel dysfunction. No hx of trauma. Negative for fever,changes in appetite, night sweats,abdominal pain,or changes in bowel habits.  Stress incontinence, exacerbated by coughing and sneezing. Urinary urgency. Problem has been going on for months, since pregnancy. Negative for dysuria, gross hematuria, or decreased urine output.  Knee crepitus and pain + ankle pain. Negative for family history of rheumatoid arthritis. She has not noted joint edema or erythema. Pain with movement and certain activities. Alleviated by rest. She has not tried OTC medication.  For the past 15 days she has had intermittent vaginal bleeding, blood noted on tissue after urination. Pelvic cramps, similar to those she had with menses before. No associated abdominal pain,N/V,dispareunia,or vaginal discharge.  Nexplanon placed after delivering her last child, 11/2019. She wonders if implant is causing headaches.  Lab Results  Component Value Date   WBC 7.5 08/09/2019   HGB 12.5 08/09/2019   HGB 12.7 08/09/2019   HCT 37.2 08/09/2019   HCT 36.6 08/09/2019   MCV 87 08/09/2019   PLT 271 08/09/2019   PLT 283 08/09/2019    For the past 5 to 6 months she has had parietal, frontal, and temporal headache. Daily, dull, 9/10.  She has not identified exacerbating or alleviating factors.  Occasionally associated nausea. No associated photophobia, phonophobia, or vomiting. Tylenol does not  help.  Review of Systems  Constitutional: Positive for fatigue. Negative for activity change.  HENT: Negative for mouth sores, nosebleeds and sore throat.   Eyes: Negative for pain and visual disturbance.  Respiratory: Negative for cough, shortness of breath and wheezing.   Cardiovascular: Negative for chest pain, palpitations and leg swelling.  Endocrine: Negative for cold intolerance and heat intolerance.  Genitourinary: Negative for flank pain and genital sores.  Skin: Negative for pallor and rash.  Allergic/Immunologic: Negative for environmental allergies.  Neurological: Negative for syncope, facial asymmetry, speech difficulty and weakness.  Hematological: Negative for adenopathy. Does not bruise/bleed easily.  Psychiatric/Behavioral: Negative for confusion. The patient is nervous/anxious.   Rest see pertinent positives and negatives per HPI.  Current Outpatient Medications on File Prior to Visit  Medication Sig Dispense Refill  . acetaminophen (TYLENOL) 500 MG tablet Take by mouth.    . Prenatal Vit-Fe Fumarate-FA (PRENATAL MULTIVITAMIN) TABS tablet Take 1 tablet by mouth daily at 12 noon.     No current facility-administered medications on file prior to visit.   Past Medical History:  Diagnosis Date  . Anemia    Hx when younger  . History of UTI    In first pregnancy  . Infection    History of intestinal infection  . Pre-eclampsia   . Pregnancy induced hypertension    2017-labor induced   No Known Allergies  Family History  Problem Relation Age of Onset  . Diabetes Mother   . Hypertension Mother   . Healthy Father   . Hypertension Maternal Grandmother   . Diabetes Maternal Grandmother     Social History   Socioeconomic History  . Marital status: Married  Spouse name: Carolos  . Number of children: 1  . Years of education: 83  . Highest education level: Not on file  Occupational History  . Not on file  Tobacco Use  . Smoking status: Never Smoker  .  Smokeless tobacco: Never Used  Vaping Use  . Vaping Use: Never used  Substance and Sexual Activity  . Alcohol use: Not Currently  . Drug use: Never  . Sexual activity: Yes    Birth control/protection: None  Other Topics Concern  . Not on file  Social History Narrative  . Not on file   Social Determinants of Health   Financial Resource Strain: Not on file  Food Insecurity: Food Insecurity Present  . Worried About Programme researcher, broadcasting/film/video in the Last Year: Sometimes true  . Ran Out of Food in the Last Year: Sometimes true  Transportation Needs: No Transportation Needs  . Lack of Transportation (Medical): No  . Lack of Transportation (Non-Medical): No  Physical Activity: Not on file  Stress: Not on file  Social Connections: Not on file   Vitals:   06/08/20 1548  BP: 110/80  Pulse: 95  Resp: 12  Temp: 98.9 F (37.2 C)  SpO2: 96%   Body mass index is 29.36 kg/m.  Physical Exam Vitals and nursing note reviewed.  Constitutional:      General: She is not in acute distress.    Appearance: She is well-developed.  HENT:     Head: Normocephalic and atraumatic.     Mouth/Throat:     Mouth: Mucous membranes are moist.     Pharynx: Oropharynx is clear.  Eyes:     Conjunctiva/sclera: Conjunctivae normal.     Pupils: Pupils are equal, round, and reactive to light.  Cardiovascular:     Rate and Rhythm: Normal rate and regular rhythm.     Pulses:          Dorsalis pedis pulses are 2+ on the right side and 2+ on the left side.     Heart sounds: No murmur heard.   Pulmonary:     Effort: Pulmonary effort is normal. No respiratory distress.     Breath sounds: Normal breath sounds.  Abdominal:     Palpations: Abdomen is soft. There is no hepatomegaly or mass.     Tenderness: There is no abdominal tenderness.  Genitourinary:    Comments: Deferred to gyn. Musculoskeletal:     Cervical back: Tenderness present. No bony tenderness.     Thoracic back: Tenderness present. No bony  tenderness.     Lumbar back: Tenderness present. No bony tenderness. Negative right straight leg raise test and negative left straight leg raise test.       Back:     Comments: No signs of synovitis.  Lymphadenopathy:     Cervical: No cervical adenopathy.  Skin:    General: Skin is warm.     Findings: No erythema or rash.  Neurological:     General: No focal deficit present.     Mental Status: She is alert and oriented to person, place, and time.     Cranial Nerves: No cranial nerve deficit.     Gait: Gait normal.     Deep Tendon Reflexes:     Reflex Scores:      Patellar reflexes are 2+ on the right side and 2+ on the left side. Psychiatric:        Mood and Affect: Mood is anxious.     Comments: Well groomed,  good eye contact.   ASSESSMENT AND PLAN:  Barbara Noble was seen today for establish care.  Diagnoses and all orders for this visit: Orders Placed This Encounter  Procedures  . Ambulatory referral to Physical Therapy  . Ambulatory referral to Gynecology    Tension headache We discussed possible etiologies. Examination and history do not suggest a serious process. She agrees with trial of amitriptyline, starting with 12.5 mg at bedtime, instructed to increase dose to 25 mg in 2 weeks if she is tolerating medication well. I do not think imaging is needed at this time. Instructed about warning signs. Follow-up in 6 weeks with headache diary.  -     amitriptyline (ELAVIL) 25 MG tablet; Take 1 tablet (25 mg total) by mouth at bedtime.  Polyarthralgia ?  OA vs rheumatologic disorder. Weight loss will help. We do not have lab service at this time. Next visit we will plan on checking CRP, RF, CCP.  Chronic bilateral low back pain with right-sided sciatica For now we will hold on lumbar imaging. Amitriptyline may help. We will follow in 6 weeks and determine the need for lumbar MRI. PT will be arranged.  -     amitriptyline (ELAVIL) 25 MG tablet; Take 1 tablet (25 mg  total) by mouth at bedtime.   DUB (dysfunctional uterine bleeding) Recommend arranging appointment with gynecologist, referral placed. It could be related with Nexplanon, other possible causes discussed. We will plan on checking CBC and TSH next visit.  Upper back pain Amitriptyline may help. PT will be arranged.   Return in about 6 weeks (around 07/20/2020) for HA,back pain.   Mance Vallejo G. [place], MD  [facility]. [place] office.   A few things to remember from today's visit:   Tension headache - Plan: amitriptyline (ELAVIL) 25 MG tablet  DUB (dysfunctional uterine bleeding)  Polyarthralgia  Chronic bilateral low back pain with right-sided sciatica - Plan: amitriptyline (ELAVIL) 25 MG tablet  If you need refills please call your pharmacy. Do not use My Chart to request refills or for acute issues that need immediate attention.   Amitriptyline 1/2 tab a la hora de la cama y aumente dosis en 2 semanas si lo tolera bien.  Cefalea tensional en los adultos Tension Headache, Adult La cefalea tensional es una sensacin de dolor o presin en la cabeza. Suele sentirse [employer] frente y a los lados de la cabeza. Las cefaleas tensionales pueden durar de 30 minutos a [address]. Cules son las causas? Se desconoce la causa de esta afeccin. A veces, las cefaleas tensionales se producen por el estrs, la preocupacin (ansiedad) o la depresin. Otras cosas que pueden ser causarlas son las siguientes:  Alcohol.  Exceso de cafena o abstinencia de cafena.  Resfros, gripe o infecciones de los senos paranasales.  Problemas dentales. Estos pueden Arts administrator.  Cansancio.  Mantener la cabeza y el cuello en la misma posicin durante un perodo prolongado, por ejemplo, al usar la computadora.  Fumar.  Artritis en el cuello. Cules son los signos o los sntomas?  Sensacin de presin alrededor de la cabeza.  Un dolor sordo en la cabeza.  Dolor  sobre la frente y los lados de la cabeza.  Dolor a la [employer] de la cabeza, del cuello y de los hombros. Cmo se trata? Esta afeccin puede tratarse con cambios en el estilo de vida y medicamentos que ayudan a Paramedic los sntomas. Siga estas instrucciones en su casa: Control del dolor  Use los medicamentos de venta libre y los recetados solamente como se lo haya indicado el mdico.  Cuando tenga una cefalea, acustese en una habitacin oscura y silenciosa.  Si se lo indican, pngase hielo en la cabeza y el cuello. Para hacer esto: ? Ponga el hielo en una bolsa plstica. ? Coloque una [employer] piel y Copy. ? Aplique el hielo durante , 2 o 3veces por da. ? Retire el hielo si la piel se le pone de color rojo brillante. Esto es [employer]. Si no puede sentir dolor, calor o fro, tiene un mayor riesgo de que se dae la zona.  Si se lo indican, aplquese calor en la nuca. Hgalo con la frecuencia que le haya indicado el mdico. Use la fuente de calor que el mdico le recomiende, como una compresa de calor hmedo o una almohadilla trmica. ? Coloque una [employer] piel y la fuente de Airline pilot. ? Aplique calor durante 20 a . ? Retire la fuente de calor si la piel se le pone de color rojo brillante. Esto es [employer]. Si no puede sentir dolor, calor o fro, tiene un mayor riesgo de [place]. Comida y bebida  Mantenga un horario para las comidas.  Si bebe alcohol: ? Limite la cantidad que bebe a lo siguiente:  De 0 a 1 medida por da para las mujeres que no estn embarazadas.  De 0 a 2 medidas por da para los hombres. ? Sepa cunta cantidad de alcohol hay en las bebidas que toma. En los [place], una medida equivale a una botella de cerveza de 12oz ( ), un vaso de vino de 5oz ( ) o un vaso de una bebida alcohlica de alta graduacin de 1oz (39ml).  Beba suficiente lquido para Radio producer pis (orina) de  color amarillo plido.  No consuma mucha cafena ni deje de consumirla por completo. Estilo de vida  Duerma entre 7y 9horas todas las noches. O duerma la cantidad de [employer] indique el mdico.  A la hora de [place] las computadoras, los telfonos y las tabletas fuera del dormitorio.  Encuentre maneras de Museum/gallery exhibitions officer. Esto puede incluir: ? Musician. ? Respiracin profunda. ? Yoga. ? Optometrist. ? Tener pensamientos positivos.  Sintese con la espalda recta. Intentar relajar los msculos.  No fume ni consuma ningn producto que contenga nicotina o tabaco. Si necesita ayuda para dejar de consumir estos productos, consulte al mdico. Instrucciones generales  Evite las cosas que puedan provocar las cefaleas. Lleve un diario sobre las cefaleas para ver qu puede provocarlas. Registre, por ejemplo, lo siguiente: ? Lo que usted come y bebe. ? El tiempo que duerme. ? Algn cambio en su dieta o en los medicamentos.  Cumpla con todas las visitas de seguimiento.   Comunquese con un mdico si:  La cefalea no se alivia.  La cefalea regresa.  Tiene una cefalea y [employer] sonidos, la luz o los olores.  Tiene ganas de vomitar o vomita.  Le duele el estmago. Solicite ayuda de inmediato si:  De repente siente un dolor de cabeza muy intenso junto con cualquiera de estas cosas: ? Rigidez en el cuello. ? Ganas de vomitar. ? Vmitos. ? Sentirse confundido (confuso). ? Sensacin de debilidad en una parte o un lado del cuerpo. ? Problemas para ver o hablar, o ambas cosas. ? Falta de aire. ? Erupcin cutnea. ? Sentirse muy somnoliento. ? Dolor en el ojo o el odo. ?  Dificultad para caminar o mantener el equilibrio. ? Sensacin de que va a [place], o se desmaya. Resumen  La cefalea tensional es un dolor o una presin que se siente en la cabeza.  Las cefaleas tensionales pueden durar de 30 minutos a [address].  Los [employer]  en el estilo de vida y los medicamentos pueden ayudar a Engineer, materials. Esta informacin no tiene Theme park manager el consejo del mdico. Asegrese de hacerle al mdico cualquier pregunta que tenga. Document Revised: 12/10/2019 Document Reviewed: 12/10/2019 Elsevier Patient Education  2021 Elsevier Inc.   Please be sure medication list is accurate. If a new problem present, please set up appointment sooner than planned today.

## 2020-08-07 ENCOUNTER — Other Ambulatory Visit: Payer: Self-pay

## 2020-08-07 ENCOUNTER — Ambulatory Visit (INDEPENDENT_AMBULATORY_CARE_PROVIDER_SITE_OTHER): Payer: 59 | Admitting: Family Medicine

## 2020-08-07 ENCOUNTER — Encounter: Payer: Self-pay | Admitting: Family Medicine

## 2020-08-07 VITALS — BP 100/60 | HR 83 | Temp 98.1°F | Resp 12 | Ht 70.0 in | Wt 200.4 lb

## 2020-08-07 DIAGNOSIS — G44209 Tension-type headache, unspecified, not intractable: Secondary | ICD-10-CM | POA: Diagnosis not present

## 2020-08-07 DIAGNOSIS — I889 Nonspecific lymphadenitis, unspecified: Secondary | ICD-10-CM

## 2020-08-07 DIAGNOSIS — E785 Hyperlipidemia, unspecified: Secondary | ICD-10-CM | POA: Diagnosis not present

## 2020-08-07 DIAGNOSIS — M5441 Lumbago with sciatica, right side: Secondary | ICD-10-CM

## 2020-08-07 DIAGNOSIS — H60502 Unspecified acute noninfective otitis externa, left ear: Secondary | ICD-10-CM

## 2020-08-07 DIAGNOSIS — Z131 Encounter for screening for diabetes mellitus: Secondary | ICD-10-CM | POA: Diagnosis not present

## 2020-08-07 DIAGNOSIS — M2550 Pain in unspecified joint: Secondary | ICD-10-CM

## 2020-08-07 DIAGNOSIS — G8929 Other chronic pain: Secondary | ICD-10-CM

## 2020-08-07 DIAGNOSIS — R2232 Localized swelling, mass and lump, left upper limb: Secondary | ICD-10-CM

## 2020-08-07 LAB — HEMOGLOBIN A1C: Hgb A1c MFr Bld: 5.7 % (ref 4.6–6.5)

## 2020-08-07 LAB — LIPID PANEL
Cholesterol: 190 mg/dL (ref 0–200)
HDL: 35 mg/dL — ABNORMAL LOW (ref >39.00)
LDL Cholesterol: 117 mg/dL — ABNORMAL HIGH (ref 0–99)
NonHDL: 154.56
Total CHOL/HDL Ratio: 5
Triglycerides: 188 mg/dL — ABNORMAL HIGH (ref 0.0–149.0)
VLDL: 37.6 mg/dL (ref 0.0–40.0)

## 2020-08-07 LAB — CBC
HCT: 40.1 % (ref 36.0–46.0)
Hemoglobin: 13.5 g/dL (ref 12.0–15.0)
MCHC: 33.6 g/dL (ref 30.0–36.0)
MCV: 83 fl (ref 78.0–100.0)
Platelets: 261 10*3/uL (ref 150.0–400.0)
RBC: 4.83 Mil/uL (ref 3.87–5.11)
RDW: 14 % (ref 11.5–15.5)
WBC: 5.2 10*3/uL (ref 4.0–10.5)

## 2020-08-07 LAB — COMPREHENSIVE METABOLIC PANEL
ALT: 24 U/L (ref 0–35)
AST: 16 U/L (ref 0–37)
Albumin: 4.3 g/dL (ref 3.5–5.2)
Alkaline Phosphatase: 72 U/L (ref 39–117)
BUN: 13 mg/dL (ref 6–23)
CO2: 26 mEq/L (ref 19–32)
Calcium: 9.3 mg/dL (ref 8.4–10.5)
Chloride: 104 mEq/L (ref 96–112)
Creatinine, Ser: 0.67 mg/dL (ref 0.40–1.20)
GFR: 115.91 mL/min (ref >60.00)
Glucose, Bld: 94 mg/dL (ref 70–99)
Potassium: 4.2 mEq/L (ref 3.5–5.1)
Sodium: 137 mEq/L (ref 135–145)
Total Bilirubin: 0.6 mg/dL (ref 0.2–1.2)
Total Protein: 7.4 g/dL (ref 6.0–8.3)

## 2020-08-07 LAB — SEDIMENTATION RATE: Sed Rate: 35 mm/hr — ABNORMAL HIGH (ref 0–20)

## 2020-08-07 LAB — TSH: TSH: 2.54 u[IU]/mL (ref 0.35–4.50)

## 2020-08-07 LAB — C-REACTIVE PROTEIN: CRP: <1 mg/dL (ref 0.5–20.0)

## 2020-08-07 NOTE — Patient Instructions (Addendum)
A few things to remember from today's visit:   Chronic bilateral low back pain with right-sided sciatica - Plan: Ambulatory referral to Orthopedic Surgery  Polyarthralgia - Plan: Comprehensive metabolic panel, CBC, TSH, Sedimentation rate, C-reactive protein, Rheumatoid factor, Cyclic citrul peptide antibody, IgG  Tension headache  Arm mass, left  Hyperlipidemia, unspecified hyperlipidemia type - Plan: Comprehensive metabolic panel, Lipid panel  Diabetes mellitus screening - Plan: Hemoglobin A1c  Do not use My Chart to request refills or for acute issues that need immediate attention.  Lipoma Lipoma  Un lipoma es un tumor no canceroso (benigno) formado por clulas de grasa. Es un tipo muy frecuente de [employer] tejidos blandos. Por lo general, los lipomas se encuentran debajo de la piel (subcutneos). Pueden aparecer en cualquier tejido del cuerpo que contenga grasa. Las zonas en las que los lipomas aparecen con mayor frecuencia incluyen la espalda, losbrazos, los hombros, las nalgas y los muslos. Los lipomas crecen lentamente y, en general, son indoloros. La mayora de loslipomas no causan problemas y no requieren TEFL teacher. Cules son las causas? Se desconoce la causa de esta afeccin. Qu incrementa el riesgo? Es ms probable que sufra esta afeccin si: Tiene entre 40 y 22 aos de edad. Tiene antecedentes familiares de lipomas. Cules son los signos o sntomas? Por lo general, el lipoma aparece como una pequea protuberancia redonda debajo de la piel. En la [employer], el bulto tiene las siguientes caractersticas: Se siente suave o elstico. No causa dolor ni otros sntomas. Sin embargo, si el lipoma se encuentra en un rea en la que hace presin sobrelos nervios, puede causar dolor u otros sntomas. Cmo se diagnostica? Por lo general, el lipoma puede diagnosticarse con un examen fsico. Tambin pueden hacerle estudios para confirmar el diagnstico y  Sales promotion account executive otras afecciones. Las pruebas pueden incluir las siguientes: Pruebas de diagnstico por imgenes, como una exploracin por tomografa computarizada (TC) o una resonancia magntica (RM). Extraccin de [place] de tejido para analizar con un microscopio (biopsia). Cmo se trata? El tratamiento de esta afeccin depende del tamao del lipoma y si causa algn sntoma. Los lipomas pequeos que no causan problemas no requieren tratamiento. Si un lipoma se agranda o causa problemas, puede realizarse [place] para extirpar el lipoma. Los lipomas tambin pueden extirparse para mejorar el aspecto. En la [employer], [place] procedimiento se realiza despus de aplicar un medicamento que adormece el rea (anestesia local). Pueden realizarse una liposuccin para reducir el tamao del lipoma antes de que se lo extraigan quirrgicamente, o bien se la puede realizar para extirpar el lipoma. Los lipomas se extirpan con este mtodo para limitar el tamao de la incisin y las cicatrices. Se introduce un tubo de liposuccin a travs de una pequea incisin en el lipoma y el contenido del lipoma se extrae a travs del tubo mediante succin. Siga estas indicaciones en casa: Controle el lipoma para detectar cambios. Concurra a todas las visitas de [address] se lo haya indicado el mdico. Esto es importante. Comunquese con un mdico si: El lipoma se agranda o se endurece. El lipoma comienza a causarle dolor, se enrojece o se hincha cada vez ms. Estos podran ser signos de infeccin o de una afeccin ms grave. Solicite ayuda de inmediato si: Siente hormigueo o adormecimiento en el rea cerca del lipoma. Esto podra indicar que el lipoma es lo que causa dao nervioso. Resumen Un lipoma es un tumor no canceroso formado por clulas de grasa. Katha Hamm[place]  de los lipomas no causan problemas y no requieren tratamiento. Si un lipoma se agranda o causa problemas, puede realizarse [place] para  extirpar el lipoma. Comunquese con un mdico si el lipoma se agranda o se endurece, o si empieza a dolor, se pone de color rojo o se hincha cada vez ms. El dolor, el enrojecimiento y la hinchazn podran ser signos de infeccin o de una afeccin ms grave. Esta informacin no tiene Theme park manager el consejo del mdico. Asegresede hacerle al mdico cualquier pregunta que tenga. Document Revised: 11/27/2018 Document Reviewed: 11/27/2018 Elsevier Patient Education  2022 Elsevier Inc.  Please be sure medication list is accurate. If a new problem present, please set up appointment sooner than planned today. Pare Amitriptyline. Mantenga la cita con el gynecologo. Cita con ortopedista pendiente.

## 2020-08-07 NOTE — Progress Notes (Addendum)
Chief Complaint  Patient presents with   Headache    Patient complains of headaches, x1 month,    Neck Pain    Patient complains of knot on left side of neck, x2 months, Patient reports slight ear pain when touching side of neck Patient tried Pain medication with little relief   HPI: [name] is a 30 y.o. female, who is here today complaining of persistent headache and left-sided tender gland as described above.  She was last seen on 06/08/20 because headache, thought it was more like tension headache. Amitriptyline 25 mg was recommended. She noted improvement for a few days but headache is back to her baseline. Parietal,frontal,and bitemporal headache. Not sure about exacerbating or alleviating factors. No associated photophobia,vomiting,or focal neurologist deficit. Sometimes nausea. She has had headaches since she had nexplanon placed.  Last visit labs were ordered, she was also concerned about LE joint pain: Knees and ankle. Exacerbated by repetitive activities that involve walking/standing.  Lower back pain radiated to RLE, problem has been going on for over 6 months. Bending aggravates pain. + Stiffness. Negative for saddle anesthesia or bowel/bladder dysfunction. PT did not help. Amitriptyline did not help with back pain. Problem is stable.  She has had dizziness, sense of movement, started 15 days ago,intermittent. Left earache, which has been going on intermittently for years. Noted again 4 days ago, she noted swollen and tender gland.  For a while she has noted "lump" in left arm.It is not tender, not sure about growth. She has not noted erythema or skin changes.  She would like labs done, concerned about diabetes and thyroid disease. Reporting mild HLD.  Review of Systems  Constitutional:  Positive for fatigue. Negative for activity change, appetite change and fever.  HENT:  Negative for hearing loss, mouth sores, nosebleeds and sore throat.   Eyes:   Negative for redness and visual disturbance.  Respiratory:  Negative for cough, shortness of breath and wheezing.   Cardiovascular:  Negative for chest pain, palpitations and leg swelling.  Gastrointestinal:  Negative for abdominal pain, nausea and vomiting.       Negative for changes in bowel habits.  Genitourinary:  Negative for decreased urine volume, dysuria and hematuria.  Musculoskeletal:  Negative for gait problem.  Neurological:  Negative for syncope, facial asymmetry and weakness.  Psychiatric/Behavioral:  Negative for confusion. The patient is nervous/anxious.   Rest see pertinent positives and negatives per HPI.  Current Outpatient Medications on File Prior to Visit  Medication Sig Dispense Refill   acetaminophen (TYLENOL) 500 MG tablet Take by mouth.     Prenatal Vit-Fe Fumarate-FA (PRENATAL MULTIVITAMIN) TABS tablet Take 1 tablet by mouth daily at 12 noon.     No current facility-administered medications on file prior to visit.   Past Medical History:  Diagnosis Date   Anemia    Hx when younger   History of UTI    In first pregnancy   Infection    History of intestinal infection   Pre-eclampsia    Pregnancy induced hypertension    2017-labor induced   No Known Allergies  Social History   Socioeconomic History   Marital status: Married    Spouse name: Sports coach   Number of children: 1   Years of education: 14   Highest education level: Not on file  Occupational History   Not on file  Tobacco Use   Smoking status: Never   Smokeless tobacco: Never  Vaping Use   Vaping Use: Never used  Substance and Sexual Activity   Alcohol use: Not Currently   Drug use: Never   Sexual activity: Yes    Birth control/protection: None  Other Topics Concern   Not on file  Social History Narrative   Not on file   Social Determinants of Health   Financial Resource Strain: Not on file  Food Insecurity: Not on file  Transportation Needs: Not on file  Physical Activity:  Not on file  Stress: Not on file  Social Connections: Not on file    Vitals:   08/07/20 1010  BP: 100/60  Pulse: 83  Resp: 12  Temp: 98.1 F (36.7 C)  SpO2: 96%   Body mass index is 28.75 kg/m.  Physical Exam Vitals and nursing note reviewed.  Constitutional:      General: She is not in acute distress.    Appearance: She is well-developed.  HENT:     Head: Normocephalic and atraumatic.     Right Ear: Tympanic membrane, ear canal and external ear normal.     Left Ear: Tympanic membrane and external ear normal.     Ears:     Comments: Left ear canal scaly/dry. There is no erythema or edema.    Mouth/Throat:     Mouth: Mucous membranes are moist.     Pharynx: Oropharynx is clear.  Eyes:     Conjunctiva/sclera: Conjunctivae normal.  Cardiovascular:     Rate and Rhythm: Normal rate and regular rhythm.     Pulses:          Dorsalis pedis pulses are 2+ on the right side and 2+ on the left side.     Heart sounds: No murmur heard. Pulmonary:     Effort: Pulmonary effort is normal. No respiratory distress.     Breath sounds: Normal breath sounds.  Abdominal:     Palpations: Abdomen is soft. There is no hepatomegaly or mass.     Tenderness: There is no abdominal tenderness.  Musculoskeletal:       Arms:     Lumbar back: Tenderness present. No bony tenderness. Negative right straight leg raise test and negative left straight leg raise test.       Back:  Lymphadenopathy:     Head:     Right side of head: No submandibular adenopathy.     Left side of head: Submandibular (1 cm, tender.) adenopathy present. No preauricular or posterior auricular adenopathy.     Cervical: No cervical adenopathy.  Skin:    General: Skin is warm.     Findings: No erythema or rash.  Neurological:     General: No focal deficit present.     Mental Status: She is alert and oriented to person, place, and time.     Cranial Nerves: No cranial nerve deficit.     Gait: Gait normal.  Psychiatric:      Comments: Well groomed, good eye contact.    ASSESSMENT AND PLAN:  Barbara Noble was seen today for headache and neck pain.  Diagnoses and all orders for this visit: Orders Placed This Encounter  Procedures   Comprehensive metabolic panel   CBC   TSH   Sedimentation rate   Lipid panel   Hemoglobin A1c   C-reactive protein   Rheumatoid factor   Cyclic citrul peptide antibody, IgG   Ambulatory referral to Orthopedic Surgery   Lab Results  Component Value Date   CREATININE 0.67 08/07/2020   BUN 13 08/07/2020   NA 137 08/07/2020   K 4.2  08/07/2020   CL 104 08/07/2020   CO2 26 08/07/2020   Lab Results  Component Value Date   ALT 24 08/07/2020   AST 16 08/07/2020   ALKPHOS 72 08/07/2020   BILITOT 0.6 08/07/2020   Lab Results  Component Value Date   TSH 2.54 08/07/2020   Lab Results  Component Value Date   HGBA1C 5.7 08/07/2020   Lab Results  Component Value Date   CHOL 190 08/07/2020   HDL 35.00 (L) 08/07/2020   LDLCALC 117 (H) 08/07/2020   TRIG 188.0 (H) 08/07/2020   CHOLHDL 5 08/07/2020   Lab Results  Component Value Date   WBC 5.2 08/07/2020   HGB 13.5 08/07/2020   HCT 40.1 08/07/2020   MCV 83.0 08/07/2020   PLT 261.0 08/07/2020   Lab Results  Component Value Date   CRP <1.0 08/07/2020   Chronic bilateral low back pain with right-sided sciatica Problem did not improve with PT nor amitriptyline. Ortho referral placed.  Polyarthralgia We discussed possible etiologies. Wt loss will help. Further recommendations according to lab results.  Tension headache Other possible etiologies discussed. Amitriptyline did not help much, so d/c. She has an appt with her gyn to have Nexplanon removed, which she thinks has been causing headaches. Instructed about warning signs.  Arm mass, left ? Lipoma. Reassured. Continue monitoring for now.  Hyperlipidemia, unspecified hyperlipidemia type Low fat diet recommended for now. Further recommendations  according to lipid panel results.  Diabetes mellitus screening -     Hemoglobin A1c  Acute otitis externa of left ear, unspecified type Otic drops to use bid for 7 days then prn. Monitor for new symptoms.  -     acetic acid-hydrocortisone (VOSOL-HC) OTIC solution; Place 3 drops into the left ear 2 (two) times daily as needed.  Submandibular lymphadenitis We discussed possible etiologies. Continue monitoring for now. If not resolved in 4-6 weeks, imaging may be necessary. Further recommendations according to lab results.   Return if symptoms worsen or fail to improve.   Oberon Hehir G. [place], MD  [facility]. [place] office.   A few things to remember from today's visit:   Chronic bilateral low back pain with right-sided sciatica - Plan: Ambulatory referral to Orthopedic Surgery  Polyarthralgia - Plan: Comprehensive metabolic panel, CBC, TSH, Sedimentation rate, C-reactive protein, Rheumatoid factor, Cyclic citrul peptide antibody, IgG  Tension headache  Arm mass, left  Hyperlipidemia, unspecified hyperlipidemia type - Plan: Comprehensive metabolic panel, Lipid panel  Diabetes mellitus screening - Plan: Hemoglobin A1c  Do not use My Chart to request refills or for acute issues that need immediate attention.  Lipoma Lipoma  Un lipoma es un tumor no canceroso (benigno) formado por clulas de grasa. Es un tipo muy frecuente de [employer] tejidos blandos. Por lo general, los lipomas se encuentran debajo de la piel (subcutneos). Pueden aparecer en cualquier tejido del cuerpo que contenga grasa. Las zonas en las que los lipomas aparecen con mayor frecuencia incluyen la espalda, losbrazos, los hombros, las nalgas y los muslos. Los lipomas crecen lentamente y, en general, son indoloros. La mayora de loslipomas no causan problemas y no requieren TEFL teacher. Cules son las causas? Se desconoce la causa de esta afeccin. Qu incrementa el riesgo? Es ms  probable que sufra esta afeccin si: Tiene entre 40 y 31 aos de edad. Tiene antecedentes familiares de lipomas. Cules son los signos o sntomas? Por lo general, el lipoma aparece como una pequea protuberancia redonda debajo de la piel. En la  mayora de los [place], el bulto tiene las siguientes caractersticas: Se siente suave o elstico. No causa dolor ni otros sntomas. Sin embargo, si el lipoma se encuentra en un rea en la que hace presin sobrelos nervios, puede causar dolor u otros sntomas. Cmo se diagnostica? Por lo general, el lipoma puede diagnosticarse con un examen fsico. Tambin pueden hacerle estudios para confirmar el diagnstico y Sales promotion account executivedescartar otras afecciones. Las pruebas pueden incluir las siguientes: Pruebas de diagnstico por imgenes, como una exploracin por tomografa computarizada (TC) o una resonancia magntica (RM). Extraccin de [place]una muestra de tejido para analizar con un microscopio (biopsia). Cmo se trata? El tratamiento de esta afeccin depende del tamao del lipoma y si causa algn sntoma. Los lipomas pequeos que no causan problemas no requieren tratamiento. Si un lipoma se agranda o causa problemas, puede realizarse [place] ciruga para extirpar el lipoma. Los lipomas tambin pueden extirparse para mejorar el aspecto. En la International Business Machinesmayora de los casos, Oregonel procedimiento se realiza despus de aplicar un medicamento que adormece el rea (anestesia local). Pueden realizarse una liposuccin para reducir el tamao del lipoma antes de que se lo extraigan quirrgicamente, o bien se la puede realizar para extirpar el lipoma. Los lipomas se extirpan con este mtodo para limitar el tamao de la incisin y las cicatrices. Se introduce un tubo de liposuccin a travs de una pequea incisin en el lipoma y el contenido del lipoma se extrae a travs del tubo mediante succin. Siga estas indicaciones en casa: Controle el lipoma para detectar cambios. Concurra a todas las visitas de  [address]segu[address] como se lo haya indicado el mdico. Esto es importante. Comunquese con un mdico si: El lipoma se agranda o se endurece. El lipoma comienza a causarle dolor, se enrojece o se hincha cada vez ms. Estos podran ser signos de infeccin o de una afeccin ms grave. Solicite ayuda de inmediato si: Siente hormigueo o adormecimiento en el rea cerca del lipoma. Esto podra indicar que el lipoma es lo que causa dao nervioso. Resumen Un lipoma es un tumor no canceroso formado por clulas de grasa. La mayora de los lipomas no causan problemas y no requieren TEFL teachertratamiento. Si un lipoma se agranda o causa problemas, puede realizarse [place] ciruga para extirpar el lipoma. Comunquese con un mdico si el lipoma se agranda o se endurece, o si empieza a dolor, se pone de color rojo o se hincha cada vez ms. El dolor, el enrojecimiento y la hinchazn podran ser signos de infeccin o de una afeccin ms grave. Esta informacin no tiene Theme park managercomo fin reemplazar el consejo del mdico. Asegresede hacerle al mdico cualquier pregunta que tenga. Document Revised: 11/27/2018 Document Reviewed: 11/27/2018 Elsevier Patient Education  2022 Elsevier Inc.  Please be sure medication list is accurate. If a new problem present, please set up appointment sooner than planned today. Pare Amitriptyline. Mantenga la cita con el gynecologo. Cita con ortopedista pendiente.

## 2020-08-09 ENCOUNTER — Encounter: Payer: Self-pay | Admitting: Family Medicine

## 2020-08-09 MED ORDER — HYDROCORTISONE-ACETIC ACID 1-2 % OT SOLN: 3.0000 [drp] | Freq: Two times a day (BID) | OTIC | 0 refills | Status: DC | PRN

## 2020-08-10 LAB — CYCLIC CITRUL PEPTIDE ANTIBODY, IGG: Cyclic Citrullin Peptide Ab: <16 UNITS

## 2020-08-10 LAB — RHEUMATOID FACTOR: Rheumatoid fact SerPl-aCnc: <14 IU/mL (ref <14)

## 2020-08-19 ENCOUNTER — Other Ambulatory Visit: Payer: Self-pay

## 2020-08-19 ENCOUNTER — Ambulatory Visit (INDEPENDENT_AMBULATORY_CARE_PROVIDER_SITE_OTHER): Payer: 59

## 2020-08-19 ENCOUNTER — Ambulatory Visit (INDEPENDENT_AMBULATORY_CARE_PROVIDER_SITE_OTHER): Payer: 59 | Admitting: Family Medicine

## 2020-08-19 ENCOUNTER — Encounter: Payer: Self-pay | Admitting: Family Medicine

## 2020-08-19 DIAGNOSIS — G8929 Other chronic pain: Secondary | ICD-10-CM

## 2020-08-19 DIAGNOSIS — M5441 Lumbago with sciatica, right side: Secondary | ICD-10-CM

## 2020-08-19 NOTE — Progress Notes (Signed)
Office Visit Note   Patient: Barbara Noble           Date of Birth: April 09, 1990           MRN: [id number] Visit Date: 08/19/2020 Requested by: [place], Betty G, MD [address] [place],  [place] [address] PCP: [place], Betty G, MD  Subjective: Chief Complaint  Patient presents with   Lower Back - Pain    Pain across lower back and down the posterior thigh. This started while she was pregnant with her 51 month old child, and has continued. Hurts to bend over at the waist. When she stands up after sitting a while, she will feel pain in the right posterior ankle.     HPI: She is here with low back and right leg pain.  Symptoms started a couple years ago with pain in the posterior hips and low back.  Then when she was pregnant with her now 46-month-old child, the pain got significantly worse with radiation down the right leg into the foot.  The pain is constant but is better when lying on her side.  Denies any bowel or bladder dysfunction, numbness or weakness in her leg.  She has not been to a physical therapist or chiropractor.  She does not take medication on a regular basis.  Interpreter was present today, and her husband and 2 children were also with her.  Patient did not have any troubles with her 50-year-old during that pregnancy.  She has a family history of scoliosis in her father.                ROS:   All other systems were reviewed and are negative.  Objective: Vital Signs: There were no vitals taken for this visit.  Physical Exam:  General:  Alert and oriented, in no acute distress. Pulm:  Breathing unlabored. Psy:  Normal mood, congruent affect. Skin: No rash Low back: I do not detect much in the way of scoliosis.  She is able to bend forward and touch her toes.  Straight leg raise is negative.  She is very tender to palpation in the midline at the L5-S1 level and over both sacroiliac joints.  Lower extremity strength and reflexes are normal.  She has good range of  motion of both hips with no pain on passive motion.    Imaging: XR Lumbar Spine 2-3 Views  Result Date: 08/19/2020 X-rays of the lumbar spine reveal no sign of scoliosis, no compression fractures, no sign of neoplasm.  Disc spaces are well-preserved.  She has sacralization of L5.  Hip joints look normal.   Assessment & Plan: Chronic low back pain with right-sided sciatica, concerning for lumbar disc protrusion. -We will try physical therapy.  She did not want any medications.  If she fails to improve, then MRI scan.     Procedures: No procedures performed        PMFS History: Patient Active Problem List   Diagnosis Date Noted   Fetal abnormality affecting management of mother, antepartum 09/02/2019   Rubella non-immune status, antepartum 08/12/2019   Supervision of other normal pregnancy, antepartum 08/09/2019   History of pre-eclampsia 08/09/2019   Elevated blood pressure affecting pregnancy, antepartum 08/09/2019   History of removal of ovarian cyst 08/09/2019   Late prenatal care @20  wks 08/09/2019   Past Medical History:  Diagnosis Date   Anemia    Hx when younger   History of UTI    In first pregnancy   Infection  History of intestinal infection   Pre-eclampsia    Pregnancy induced hypertension    2017-labor induced    Family History  Problem Relation Age of Onset   Diabetes Mother    Hypertension Mother    Healthy Father    Hypertension Maternal Grandmother    Diabetes Maternal Grandmother     Past Surgical History:  Procedure Laterality Date   APPENDECTOMY     OVARIAN CYST REMOVAL     Social History   Occupational History   Not on file  Tobacco Use   Smoking status: Never   Smokeless tobacco: Never  Vaping Use   Vaping Use: Never used  Substance and Sexual Activity   Alcohol use: Not Currently   Drug use: Never   Sexual activity: Yes    Birth control/protection: None

## 2020-08-24 ENCOUNTER — Other Ambulatory Visit: Payer: Self-pay

## 2020-08-24 ENCOUNTER — Encounter: Payer: Self-pay | Admitting: Obstetrics & Gynecology

## 2020-08-24 ENCOUNTER — Ambulatory Visit (INDEPENDENT_AMBULATORY_CARE_PROVIDER_SITE_OTHER): Payer: 59 | Admitting: Obstetrics & Gynecology

## 2020-08-24 VITALS — BP 138/88 | HR 70 | Resp 14 | Ht 64.75 in | Wt 204.0 lb

## 2020-08-24 DIAGNOSIS — Z3009 Encounter for other general counseling and advice on contraception: Secondary | ICD-10-CM

## 2020-08-24 DIAGNOSIS — Z975 Presence of (intrauterine) contraceptive device: Secondary | ICD-10-CM

## 2020-08-24 NOTE — Progress Notes (Signed)
    Barbara Noble 1990-04-08 [id number]   History:    30 y.o. G2P2L2 Accompanied by husband.  Daughter is 62 yo, son is 102 month old  RP: Side effects on Nexplanon, would like to change contraception  HPI: Postpartum insertion of Nexplanon about 8 months ago.  Since then patient has experienced mood swings, headaches, mild increase hair and tenderness of the left arm.  Has very light periods and no pelvic pain.  Would like to change contraception because of those side effects with Nexplanon.  Not breast-feeding.  Past medical history,surgical history, family history and social history were all reviewed and documented in the EPIC chart.  Gynecologic History No LMP recorded. Patient has had an implant.  Obstetric History OB History  Gravida Para Term Preterm AB Living  2 2 1 1       SAB IAB Ectopic Multiple Live Births          1    # Outcome Date GA Lbr Len/2nd Weight Sex Delivery Anes PTL Lv  2 Term 11/17/19    M Vag-Spont     1 Preterm 07/15/15 [redacted]w[redacted]d  6 lb 4.9 oz (2.859 kg) F Vag-Forceps   LIV     Complications: Preeclampsia     ROS: A ROS was performed and pertinent positives and negatives are included in the history.  GENERAL: No fevers or chills. HEENT: No change in vision, no earache, sore throat or sinus congestion. NECK: No pain or stiffness. CARDIOVASCULAR: No chest pain or pressure. No palpitations. PULMONARY: No shortness of breath, cough or wheeze. GASTROINTESTINAL: No abdominal pain, nausea, vomiting or diarrhea, melena or bright red blood per rectum. GENITOURINARY: No urinary frequency, urgency, hesitancy or dysuria. MUSCULOSKELETAL: No joint or muscle pain, no back pain, no recent trauma. DERMATOLOGIC: No rash, no itching, no lesions. ENDOCRINE: No polyuria, polydipsia, no heat or cold intolerance. No recent change in weight. HEMATOLOGICAL: No anemia or easy bruising or bleeding. NEUROLOGIC: No headache, seizures, numbness, tingling or weakness. PSYCHIATRIC: No depression, no  loss of interest in normal activity or change in sleep pattern.     Exam:   BP 138/88 (BP Location: Right Arm, Patient Position: Sitting, Cuff Size: Normal)   Pulse 70   Resp 14   Ht 5' 4.75" (1.645 m)   Wt 204 lb (92.5 kg)   BMI 34.21 kg/m   Body mass index is 34.21 kg/m.  General appearance : Well developed well nourished female. No acute distress  Pelvic: Deferred   Assessment/Plan:  30 y.o. female   1. Encounter for counseling regarding contraception Counseling on contraception.  Patient requests Nexplanon removal because of many side effects including mood swings, headaches, mild increase in hair and tenderness at the left arm.  Counseling on contraceptive methods including birth control pills, contraceptive patch, NuvaRing and IUDs both with and without progesterone.  After discussing the risks and benefits of each method, patient opts for the progesterone IUD.  Insertion procedure reviewed.  Specific risks associated with Mirena IUD discussed including displacement and infection.  Patient voiced understanding and agreement with plan.  Follow-up for Nexplanon removal and Mirena IUD insertion. - IUD Insertion; Future  2. Nexplanon in place Many side effects on Nexplanon.  Desires removal.  F/U Nexplanon removal.  Other orders - etonogestrel (NEXPLANON) 68 MG IMPL implant; 1 each by Subdermal route once.   34 MD, 11:57 AM 08/24/2020

## 2020-08-26 ENCOUNTER — Ambulatory Visit (INDEPENDENT_AMBULATORY_CARE_PROVIDER_SITE_OTHER): Payer: 59 | Admitting: Physical Therapy

## 2020-08-26 ENCOUNTER — Encounter: Payer: Self-pay | Admitting: Physical Therapy

## 2020-08-26 ENCOUNTER — Other Ambulatory Visit: Payer: Self-pay

## 2020-08-26 DIAGNOSIS — G8929 Other chronic pain: Secondary | ICD-10-CM

## 2020-08-26 DIAGNOSIS — R262 Difficulty in walking, not elsewhere classified: Secondary | ICD-10-CM

## 2020-08-26 DIAGNOSIS — M6281 Muscle weakness (generalized): Secondary | ICD-10-CM | POA: Diagnosis not present

## 2020-08-26 DIAGNOSIS — M5441 Lumbago with sciatica, right side: Secondary | ICD-10-CM | POA: Diagnosis not present

## 2020-08-26 NOTE — Therapy (Signed)
[facility] Physical Therapy [address] [place], [place], [address] Phone: [phone]   Fax:  [phone]  Physical Therapy Evaluation  Patient Details  Name: Barbara Noble MRN: [id number] Date of Birth: 1990/08/12 Referring Provider (PT): Hilts   Encounter Date: 08/26/2020   PT End of Session - 08/26/20 1354     Visit Number 1    Number of Visits 6    Date for PT Re-Evaluation 10/07/20    PT Start Time 1145    PT Stop Time 1230    PT Time Calculation (min) 45 min    Behavior During Therapy [facility] for tasks assessed/performed             Past Medical History:  Diagnosis Date   Anemia    Hx when younger   History of UTI    In first pregnancy   Infection    History of intestinal infection   Pre-eclampsia    Pregnancy induced hypertension    2017-labor induced    Past Surgical History:  Procedure Laterality Date   APPENDECTOMY     OVARIAN CYST REMOVAL      There were no vitals filed for this visit.    Subjective Assessment - 08/26/20 1150     Subjective Pain across lower back and down the posterior thigh. She says it is an intense deep pain but denies N/T or burning sensations. This started while she was pregnant with her 80 month old child, and has continued. Hurts to bend over at the waist. When she stands up after sitting a while, she will feel pain in the right posterior ankle.  The pain is constant but is better when lying on her side.  Denies any bowel or bladder dysfunction, numbness or weakness in her leg.    Limitations Walking;Lifting;Standing;House hold activities    Diagnostic tests "X-rays of the lumbar spine reveal no sign of scoliosis, no compression   fractures, no sign of neoplasm.  Disc spaces are well-preserved.  She has   sacralization of L5.  Hip joints look normal."    Patient Stated Goals reduce pain    Currently in Pain? Yes    Pain Score 8     Pain Location Back    Pain Orientation Right    Pain Descriptors /  Indicators Aching    Pain Type Chronic pain    Pain Radiating Towards down Rt leg    Pain Onset More than a month ago    Pain Frequency Intermittent    Aggravating Factors  any activity, erect posture    Pain Relieving Factors slumping, rest, laying on side                [facility] PT Assessment - 08/26/20 0001       Assessment   Medical Diagnosis chronic LBP with Rt sciatica    Referring Provider (PT) Hilts    Onset Date/Surgical Date --   one year of pain onset   Next MD Visit nothing sch    Prior Therapy none      Precautions   Precautions None      Balance Screen   Has the patient fallen in the past 6 months No    Has the patient had a decrease in activity level because of a fear of falling?  No    Is the patient reluctant to leave their home because of a fear of falling?  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      Cognition   Overall Cognitive Status Within Functional Limits for tasks assessed      Observation/Other Assessments   Focus on Therapeutic Outcomes (FOTO)  53% functional, goal 68%      ROM / Strength   AROM / PROM / Strength AROM;Strength      AROM   Overall AROM Comments lumbar ROM WNL, but painful into extension      Strength   Overall Strength Comments WFL leg strength bilat      Palpation   Palpation comment TTP L4-5, sacral segments      Special Tests   Other special tests mildly + slump test and SLR on Rt      Transfers   Transfers Independent with all Transfers      Ambulation/Gait   Gait Comments WFL                        Objective measurements completed on examination: See above findings.       [facility] Adult PT Treatment/Exercise - 08/26/20 0001       Manual Therapy   Manual therapy comments lumbar rotation spinal manipulation with +cavitation noted, long axis distraction on Rt                         PT Long Term Goals -  08/26/20 1404       PT LONG TERM GOAL #1   Title Pt will be I and compliant with HEP.    Time 6    Period Weeks    Status New    Target Date 10/07/20      PT LONG TERM GOAL #2   Title Pt will reduce pain to overall less than 4/10 with usual activity    Baseline 8    Time 6    Period Weeks    Status New      PT LONG TERM GOAL #3   Title Pt will improve FOOT to 68% functional.    Time 6    Period Weeks    Status New                    Plan - 08/26/20 1355     Clinical Impression Statement Pt presents with Chronic Rt sided LBP. Suspect disc bulge with neural tension vs sciatica as she does not have any N/T or burning down her leg but does have significant referred pain down her leg. She had positive result from spinal manipulation with overall less pain after. She will benefit from skilled PT to address her funcitonal deficits listed below.    Examination-Activity Limitations Bend;Carry;Lift;Stand;Stairs;Squat;Sleep;Locomotion Level    Examination-Participation Restrictions Cleaning;Community Activity;Shop    Stability/Clinical Decision Making Stable/Uncomplicated    Clinical Decision Making Low    Rehab Potential Good    PT Frequency 1x / week    PT Duration 6 weeks    PT Treatment/Interventions ADLs/Self Care Home Management;Cryotherapy;Electrical Stimulation;Iontophoresis 4mg /ml Dexamethasone;Moist Heat;Traction;Ultrasound;Therapeutic exercise;Neuromuscular re-education;Manual techniques;Passive range of motion;Spinal Manipulations;Joint Manipulations;Dry needling;Vasopneumatic Device;Taping    PT Next Visit Plan consider traction, spinal manip/mob, core strength    PT Home Exercise Plan [email or web address]    Consulted and Agree with Plan of Care Patient             Patient will benefit from skilled therapeutic intervention in order to improve the following deficits and impairments:  Decreased activity tolerance, Decreased coordination, Decreased range of motion,  Decreased strength, Difficulty walking, Increased muscle spasms, Impaired flexibility, Pain  Visit Diagnosis: Chronic right-sided low back pain with right-sided sciatica  Difficulty in walking, not elsewhere classified  Muscle weakness (generalized)     Problem List Patient Active Problem List   Diagnosis Date Noted   Fetal abnormality affecting management of mother, antepartum 09/02/2019   Rubella non-immune status, antepartum 08/12/2019   Supervision of other normal pregnancy, antepartum 08/09/2019   History of pre-eclampsia 08/09/2019   Elevated blood pressure affecting pregnancy, antepartum 08/09/2019   History of removal of ovarian cyst 08/09/2019   Late prenatal care @20  wks 08/09/2019    08/11/2019 08/26/2020, 2:07 PM  [facility] Physical Therapy [address] [place], [place], [place] Phone: [phone]   Fax:  [phone]  Name: Barbara Noble MRN: [id number] [id number] Date of Birth: [birth year]

## 2020-08-26 NOTE — Patient Instructions (Signed)
Access Code: [email or web address] URL: [email or web address] Date: 08/26/2020 Prepared by: Ivery Quale  Exercises Standing Lumbar Extension at Wall - Forearms - 2 x daily - 6 x weekly - 1-2 sets - 10 reps Supine Sciatic Nerve Glide - 2 x daily - 6 x weekly - 1 sets - 10 reps - 3 sec hold Supine Figure 4 Piriformis Stretch - 2 x daily - 6 x weekly - 1 sets - 3 reps - 30 hold Supine Bridge - 2 x daily - 6 x weekly - 1-2 sets - 10 reps - 5 hold Quadruped Leg Lifts - 2 x daily - 6 x weekly - 1-2 sets - 10 reps - 5 sec hold

## 2020-08-31 ENCOUNTER — Encounter: Payer: 59 | Admitting: Physical Therapy

## 2020-09-03 ENCOUNTER — Ambulatory Visit (INDEPENDENT_AMBULATORY_CARE_PROVIDER_SITE_OTHER): Payer: 59 | Admitting: Obstetrics & Gynecology

## 2020-09-03 ENCOUNTER — Other Ambulatory Visit: Payer: Self-pay

## 2020-09-03 ENCOUNTER — Encounter: Payer: Self-pay | Admitting: Obstetrics & Gynecology

## 2020-09-03 VITALS — BP 104/70 | HR 97 | Resp 16

## 2020-09-03 DIAGNOSIS — Z30430 Encounter for insertion of intrauterine contraceptive device: Secondary | ICD-10-CM

## 2020-09-03 DIAGNOSIS — Z3046 Encounter for surveillance of implantable subdermal contraceptive: Secondary | ICD-10-CM | POA: Diagnosis not present

## 2020-09-03 NOTE — Progress Notes (Signed)
Barbara Noble May 05, 1990 [id number]        30 y.o.  G2P0002   RP: Nexplanon removal/Mirena IUD Insertion  HPI: Side effects on Nexplanon.  Desires to switch to Mirena IUD.   OB History  Gravida Para Term Preterm AB Living  2 2 0 0   2  SAB IAB Ectopic Multiple Live Births          2    # Outcome Date GA Lbr Len/2nd Weight Sex Delivery Anes PTL Lv  2 Para           1 Para             Past medical history,surgical history, problem list, medications, allergies, family history and social history were all reviewed and documented in the EPIC chart.   Directed ROS with pertinent positives and negatives documented in the history of present illness/assessment and plan.  Exam:  Vitals:   09/03/20 0842  BP: 104/70  Pulse: 97  Resp: 16   General appearance:  Normal                                                             Nexplanon procedure note (removal)  The patient presented to the office today requesting for removal of her Nexplanon that was placed in the year 2021 on her left arm.   On examination the nexplanon implant was palpated and the distal end  (end  closest to the elbow) was marked. The area was sterilized with Betadine solution. 1% lidocaine was used for local anesthesia and approximately 1 cc  was injected into the site that was marked where the incision was to be made. The local anesthetic was injected under the implant in an effort to keep it  close to the skin surface. Slight pressure pushing downward was made at the proximal end  of the implant in an effort to stabilize it. A bulge appeared indicating the distal end of the implant. A small transverse incision of 2 mm was made at that location. By gently pushing the implant toward the incision, the tip became visible. Grasping the implant with a curved forcep facilitated in gently removing the implant. Full confirmation of the entire implant which is 4 cm long was inspected and was intact and was shown to the  patient and discarded. After removing the implant, the incision was closed with 3Steri-Strips, a band-aid and a bandage. Patient will be instructed to remove the pressure bandage in 24 hours, the band-aid in 3 days and the Steri-Strips in 7 days.                                                                     IUD procedure note       Patient presented to the office today for placement of Mirena IUD. The patient had previously been provided with literature information on this method of contraception. The risks benefits and pros and cons were discussed and all her questions were answered. She is fully aware that this form of contraception is  99% effective and is good for 5-7 years.  Pelvic exam: Vulva normal Vagina: No lesions or discharge Cervix: No lesions or discharge Uterus: AV position Adnexa: No masses or tenderness Rectal exam: Not done  The cervix was cleansed with Betadine solution. Hurricane spray on the cervix.  A single-tooth tenaculum was placed on the anterior cervical lip. The IUD was shown to the patient and inserted in a sterile fashion.  Hysterometry with the IUD as being inserted was 7 cm.  The IUD string was trimmed. The single-tooth tenaculum was removed. Patient was instructed to return back to the office in one month for follow up.        Assessment/Plan:  30 y.o. G2P0002   1. Encounter for Nexplanon removal Easy removal of complete intact Nexplanon device without complication.  Well-tolerated by patient.  Postprocedure precautions reviewed.  2. Encounter for IUD insertion  Easy insertion of Mirena IUD.  No complication and well-tolerated.  Precautions reviewed.  Patient will follow up in 4 weeks for IUD check.  Genia Del MD, 8:49 AM 09/03/2020

## 2020-09-05 ENCOUNTER — Encounter: Payer: Self-pay | Admitting: Obstetrics & Gynecology

## 2020-09-08 ENCOUNTER — Encounter: Payer: Self-pay | Admitting: Obstetrics & Gynecology

## 2020-09-11 ENCOUNTER — Encounter: Payer: 59 | Admitting: Physical Therapy

## 2020-09-15 ENCOUNTER — Encounter: Payer: 59 | Admitting: Physical Therapy

## 2020-09-22 ENCOUNTER — Encounter: Payer: 59 | Admitting: Physical Therapy

## 2020-09-29 ENCOUNTER — Ambulatory Visit (INDEPENDENT_AMBULATORY_CARE_PROVIDER_SITE_OTHER): Payer: 59 | Admitting: Physical Therapy

## 2020-09-29 ENCOUNTER — Encounter: Payer: Self-pay | Admitting: Physical Therapy

## 2020-09-29 ENCOUNTER — Other Ambulatory Visit: Payer: Self-pay

## 2020-09-29 DIAGNOSIS — G8929 Other chronic pain: Secondary | ICD-10-CM

## 2020-09-29 DIAGNOSIS — R262 Difficulty in walking, not elsewhere classified: Secondary | ICD-10-CM

## 2020-09-29 DIAGNOSIS — M6281 Muscle weakness (generalized): Secondary | ICD-10-CM | POA: Diagnosis not present

## 2020-09-29 DIAGNOSIS — M5441 Lumbago with sciatica, right side: Secondary | ICD-10-CM | POA: Diagnosis not present

## 2020-09-29 NOTE — Therapy (Addendum)
[facility] Physical Therapy [address] [place], [place], [address] Phone: [phone]   Fax:  [phone]  Physical Therapy Treatment/Discharge  Patient Details  Name: Barbara Noble MRN: [id number] Date of Birth: Jan 27, 1991 Referring Provider (PT): Hilts   Encounter Date: 09/29/2020   PT End of Session - 09/29/20 1123     Visit Number 2    Number of Visits 6    Date for PT Re-Evaluation 10/07/20    PT Start Time 1100    PT Stop Time 1140    PT Time Calculation (min) 40 min    Behavior During Therapy [facility] for tasks assessed/performed             Past Medical History:  Diagnosis Date   Anemia    Hx when younger   History of UTI    In first pregnancy   Infection    History of intestinal infection   Pre-eclampsia    Pregnancy induced hypertension    2017-labor induced    Past Surgical History:  Procedure Laterality Date   APPENDECTOMY     OVARIAN CYST REMOVAL      There were no vitals filed for this visit.   Subjective Assessment - 09/29/20 1101     Subjective Pt states she has been doing the HEP but the pain seems to be about the same. She states that sitting for a while and then standing is very painful. She states that R hip is very painful with walking for a while.    Patient is accompained by: Interpreter    Limitations Walking;Lifting;Standing;House hold activities    Diagnostic tests "X-rays of the lumbar spine reveal no sign of scoliosis, no compression   fractures, no sign of neoplasm.  Disc spaces are well-preserved.  She has   sacralization of L5.  Hip joints look normal."    Patient Stated Goals reduce pain    Currently in Pain? Yes    Pain Score 5     Pain Location Back    Pain Orientation Right    Pain Descriptors / Indicators Aching    Pain Onset More than a month ago                               [facility] Adult PT Treatment/Exercise - 09/29/20 0001       Exercises   Exercises Lumbar      Lumbar  Exercises: Stretches   Lower Trunk Rotation Limitations 10x each side 3s hold    Pelvic Tilt Limitations 2x10 2s      Lumbar Exercises: Supine   Bridge Limitations 2x10          Lumbar Exercises: Quadruped   Madcat/Old Horse Limitations 10x each      Knee/Hip Exercises: Seated   Other Seated Knee/Hip Exercises seated flexion stretch 10x 5s hold swissball    Other Seated Knee/Hip Exercises seated pelvic tilting 15x      Manual Therapy   Manual Therapy Soft tissue mobilization;Joint mobilization    Joint Mobilization L1-5 S/L PA    Soft tissue mobilization bilateral L3-5 paraspinals and QL                    PT Education - 09/29/20 1238     Education Details anatomy, exercise progression, DOMS expectations, muscle firing, HEP, lifting mechanics/child holding positions    Person(s) Educated Patient    Methods Explanation;Demonstration;Tactile cues;Verbal cues;Handout    Comprehension  Verbalized understanding;Returned demonstration;Verbal cues required;Tactile cues required                 PT Long Term Goals - 08/26/20 1404       PT LONG TERM GOAL #1   Title Pt will be I and compliant with HEP.    Time 6    Period Weeks    Status New    Target Date 10/07/20      PT LONG TERM GOAL #2   Title Pt will reduce pain to overall less than 4/10 with usual activity    Baseline 8    Time 6    Period Weeks    Status New      PT LONG TERM GOAL #3   Title Pt will improve FOOT to 68% functional.    Time 6    Period Weeks    Status New                   Plan - 09/29/20 1133     Clinical Impression Statement Pt with improvement in ROM today following session with report of reduced pain and stiffness following exercise and manual therapy. Pt has signficant muscle spasm and guarding around lumbar paraspinals and lower T/S. Pt with good response to mobility exercise today. HEP updated to include  mobility to compliment home strengthening program. Pt would  benefit from continued skilled therapy in order to reach goals and maximize functional lumbopelvic strength and ROM for full return to PLOF.    Examination-Activity Limitations Bend;Carry;Lift;Stand;Stairs;Squat;Sleep;Locomotion Level    Examination-Participation Restrictions Cleaning;Community Activity;Shop    Stability/Clinical Decision Making Stable/Uncomplicated    Rehab Potential Good    PT Frequency 1x / week    PT Duration 6 weeks    PT Treatment/Interventions ADLs/Self Care Home Management;Cryotherapy;Electrical Stimulation;Iontophoresis 64m/ml Dexamethasone;Moist Heat;Traction;Ultrasound;Therapeutic exercise;Neuromuscular re-education;Manual techniques;Passive range of motion;Spinal Manipulations;Joint Manipulations;Dry needling;Vasopneumatic Device;Taping    PT Next Visit Plan consider traction, spinal manip/mob, core strength    PT Home Exercise Plan [email or web address]    Consulted and Agree with Plan of Care Patient             Patient will benefit from skilled therapeutic intervention in order to improve the following deficits and impairments:  Decreased activity tolerance, Decreased coordination, Decreased range of motion, Decreased strength, Difficulty walking, Increased muscle spasms, Impaired flexibility, Pain  Visit Diagnosis: Chronic right-sided low back pain with right-sided sciatica  Difficulty in walking, not elsewhere classified  Muscle weakness (generalized)     Problem List Patient Active Problem List   Diagnosis Date Noted   Fetal abnormality affecting management of mother, antepartum 09/02/2019   Rubella non-immune status, antepartum 08/12/2019   Supervision of other normal pregnancy, antepartum 08/09/2019   History of pre-eclampsia 08/09/2019   Elevated blood pressure affecting pregnancy, antepartum 08/09/2019   History of removal of ovarian cyst 08/09/2019   Late prenatal care @20  wks 08/09/2019   ADaleen BoPT, DPT 09/29/20 12:41 PM  PHYSICAL THERAPY  DISCHARGE SUMMARY  Visits from Start of Care: 2  Current functional level related to goals / functional outcomes: See note   Remaining deficits: See note   Education / Equipment: HEP   Patient agrees to discharge. Patient goals were partially met. Patient is being discharged due to not returning since the last visit.  MScot Jun PT, DPT, OCS, ATC 11/20/20  1:34 PM     [facility] [address] [address]Phone: [phone]  Fax:  [phone]  Name: Terris Bodin MRN: [id number] Date of Birth: [birth year]

## 2020-09-29 NOTE — Patient Instructions (Signed)
Access Code: [email or web address] URL: [email or web address] Date: 09/29/2020 Prepared by: Zebedee Iba  Exercises Standing Lumbar Extension at Wall - Forearms - 2 x daily - 6 x weekly - 1-2 sets - 10 reps Supine Sciatic Nerve Glide - 2 x daily - 6 x weekly - 1 sets - 10 reps - 3 sec hold Supine Figure 4 Piriformis Stretch - 2 x daily - 6 x weekly - 1 sets - 3 reps - 30 hold Supine Bridge - 2 x daily - 6 x weekly - 1-2 sets - 10 reps - 5 hold Quadruped Leg Lifts - 2 x daily - 6 x weekly - 1-2 sets - 10 reps - 5 sec hold Supine Posterior Pelvic Tilt - 1 x daily - 6 x weekly - 2 sets - 10 reps - 2 hold Supine Lower Trunk Rotation - 1 x daily - 6 x weekly - 2 sets - 10 reps - 3 hold

## 2020-10-09 ENCOUNTER — Other Ambulatory Visit: Payer: Self-pay

## 2020-10-09 ENCOUNTER — Ambulatory Visit (INDEPENDENT_AMBULATORY_CARE_PROVIDER_SITE_OTHER): Payer: 59 | Admitting: Family Medicine

## 2020-10-09 DIAGNOSIS — G8929 Other chronic pain: Secondary | ICD-10-CM

## 2020-10-09 DIAGNOSIS — M5441 Lumbago with sciatica, right side: Secondary | ICD-10-CM | POA: Diagnosis not present

## 2020-10-09 NOTE — Progress Notes (Signed)
   Office Visit Note   Patient: Barbara Noble           Date of Birth: Aug 15, 1990           MRN: [id number] Visit Date: 10/09/2020 Requested by: [place], Betty G, MD [address] [place],  [place] [address] PCP: [place], Betty G, MD  Subjective: Chief Complaint  Patient presents with   Lower Back - Pain, Follow-up    Pain has worsened, in that it now radiates not only down the right leg, but down both legs: to the ankles. Pain in the right ankle when goes to stand up. Worse pain in the thighs with much walking.     HPI: She is here for follow-up low back pain.  Since last visit she did physical therapy and the treatments seem to make her pain worse.  The pain is constant, and it sometimes radiates into the legs when she is standing to walk.  She is discouraged by her ongoing symptoms.  Interpreter was present today.              ROS:   All other systems were reviewed and are negative.  Objective: Vital Signs: There were no vitals taken for this visit.  Physical Exam:  General:  Alert and oriented, in no acute distress. Pulm:  Breathing unlabored. Psy:  Normal mood, congruent affect.  Low back: She is tender to palpation in the midline at the L5-S1 level.  Straight leg raise is negative.  Lower extremity strength and reflexes remain normal.  Imaging: No results found.  Assessment & Plan: Worsening back pain -We will proceed with MRI scan.  Depending on the results, could contemplate epidural injection or possibly a trial of chiropractic.     Procedures: No procedures performed        PMFS History: Patient Active Problem List   Diagnosis Date Noted   Fetal abnormality affecting management of mother, antepartum 09/02/2019   Rubella non-immune status, antepartum 08/12/2019   Supervision of other normal pregnancy, antepartum 08/09/2019   History of pre-eclampsia 08/09/2019   Elevated blood pressure affecting pregnancy, antepartum 08/09/2019   History of removal of  ovarian cyst 08/09/2019   Late prenatal care @20  wks 08/09/2019   Past Medical History:  Diagnosis Date   Anemia    Hx when younger   History of UTI    In first pregnancy   Infection    History of intestinal infection   Pre-eclampsia    Pregnancy induced hypertension    2017-labor induced    Family History  Problem Relation Age of Onset   Diabetes Mother    Hypertension Mother    Healthy Father    Hypertension Maternal Grandmother    Diabetes Maternal Grandmother     Past Surgical History:  Procedure Laterality Date   APPENDECTOMY     OVARIAN CYST REMOVAL     Social History   Occupational History   Not on file  Tobacco Use   Smoking status: Never   Smokeless tobacco: Never  Vaping Use   Vaping Use: Never used  Substance and Sexual Activity   Alcohol use: Not Currently   Drug use: Never   Sexual activity: Yes    Partners: Male    Birth control/protection: Implant

## 2020-10-17 ENCOUNTER — Other Ambulatory Visit: Payer: Self-pay | Admitting: Orthopedic Surgery

## 2020-10-17 DIAGNOSIS — G8929 Other chronic pain: Secondary | ICD-10-CM

## 2020-10-29 ENCOUNTER — Ambulatory Visit
Admission: RE | Admit: 2020-10-29 | Discharge: 2020-10-29 | Disposition: A | Discharge status: Home / Self Care | Payer: 59 | Source: Ambulatory Visit | Attending: Orthopedic Surgery | Admitting: Orthopedic Surgery

## 2020-10-29 ENCOUNTER — Other Ambulatory Visit: Payer: Self-pay

## 2020-10-29 DIAGNOSIS — M5441 Lumbago with sciatica, right side: Secondary | ICD-10-CM

## 2020-10-29 DIAGNOSIS — G8929 Other chronic pain: Secondary | ICD-10-CM

## 2020-10-29 IMAGING — MR MR LUMBAR SPINE W/O CM
4 of 5 series · 25 of 48 positions shown · non-contrast
Comparison: Radiographs [DATE]

CLINICAL DATA: Chronic right-sided low back pain with right-sided
sciatica; lumbar radiculopathy, no red flags.

EXAM:
MRI LUMBAR SPINE WITHOUT CONTRAST
TECHNIQUE: Multiplanar, multisequence MR imaging of the lumbar spine was
performed. No intravenous contrast was administered.

[Series 3: T2 · sagittal · 4.0mm · 0.55mm/px · 7 of 15 slices shown (1 of 2)]
[im 1/15]
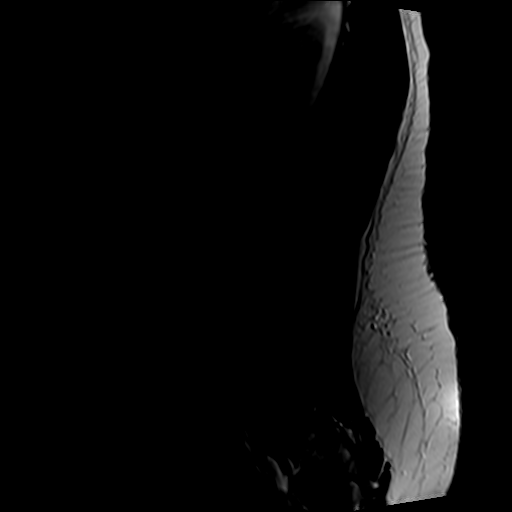
[im 3/15]
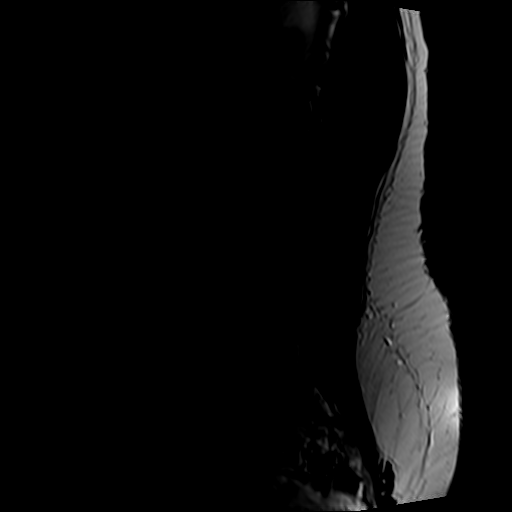
[im 5/15]
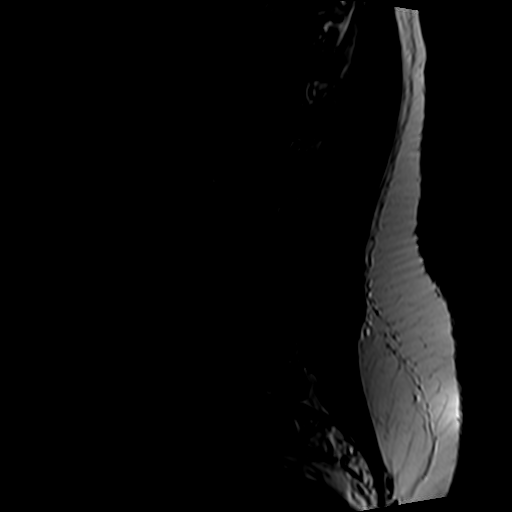
[im 8/15]
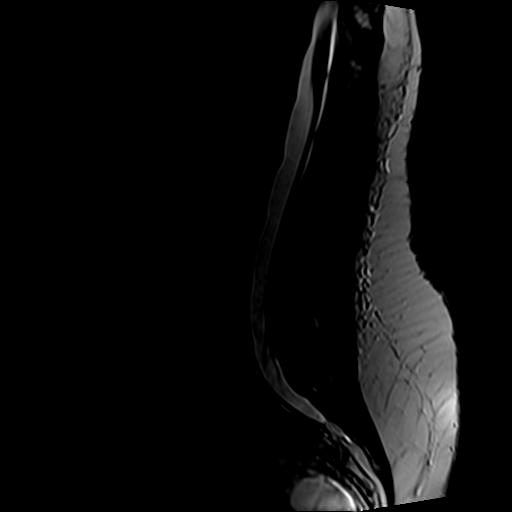
[im 10/15]
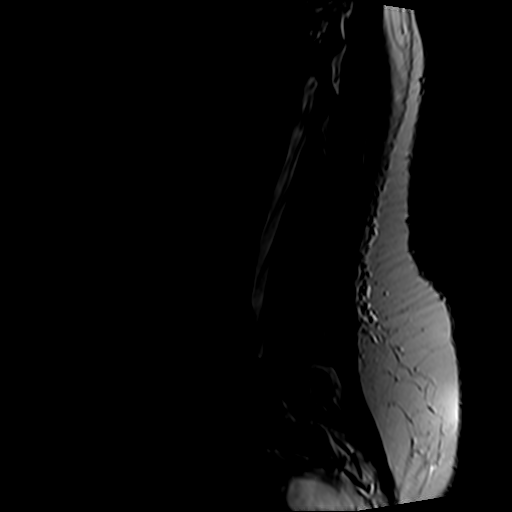
[im 12/15]
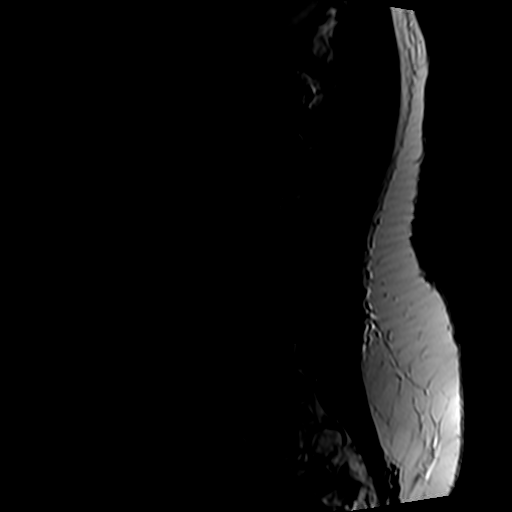
[im 15/15]
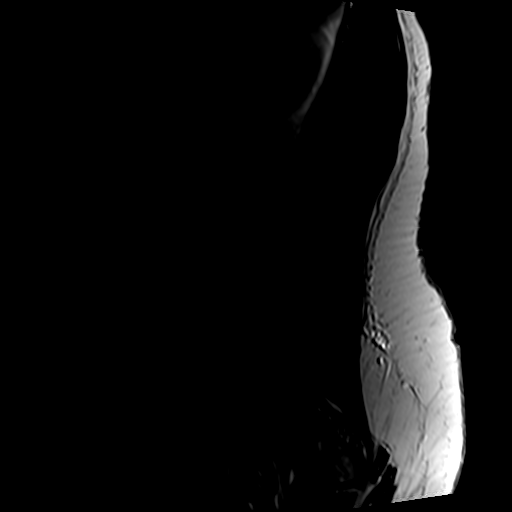

[Series 5: T1 · sagittal · 4.0mm · 0.55mm/px · 6 of 15 slices shown (1 of 2)]
[im 1/15]
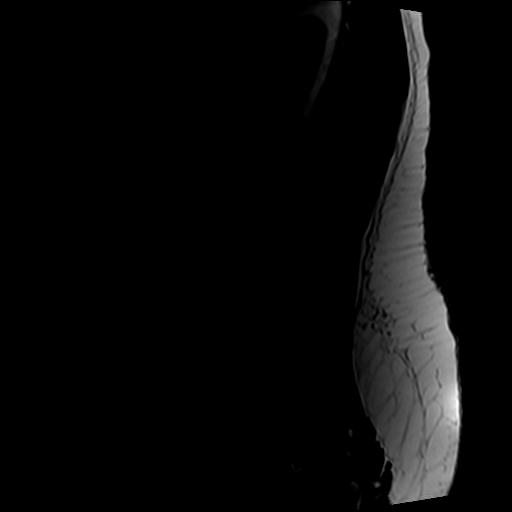
[im 3/15]
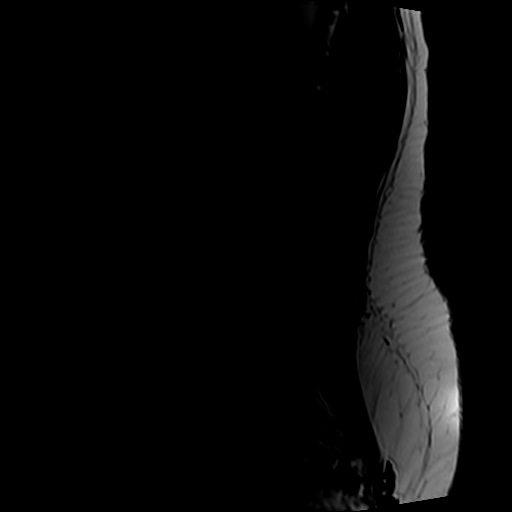
[im 6/15]
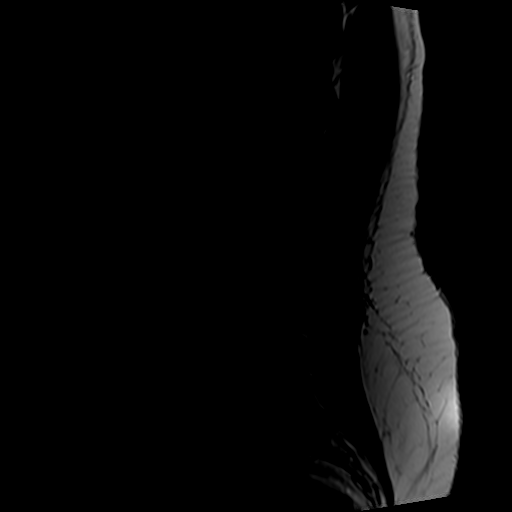
[im 9/15]
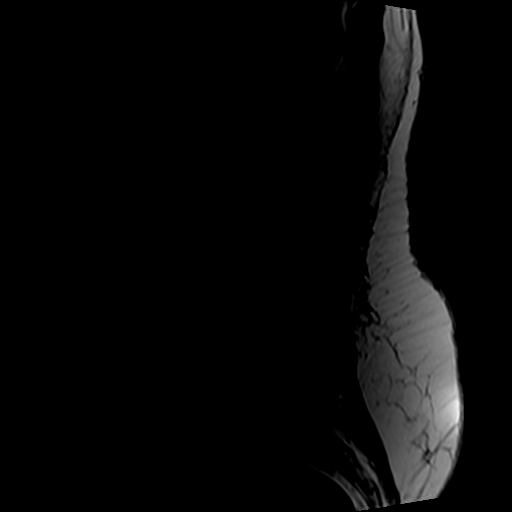
[im 12/15]
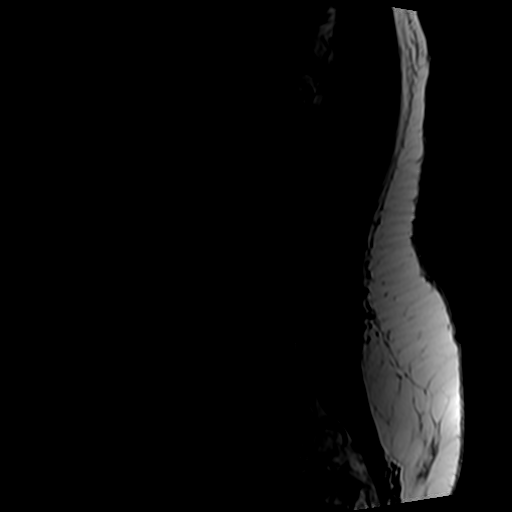
[im 15/15]
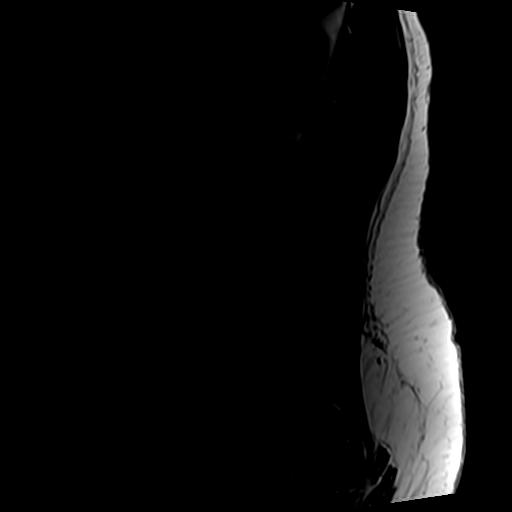

[Series 6: T2 · axial · 4.0mm · 0.70mm/px · z∈[-190,+1]mm · 8 of 33 slices shown (2 of 2)]
[im 1/33]
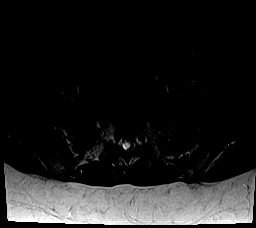
[im 5/33]
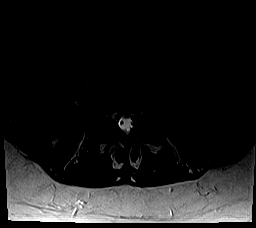
[im 10/33]
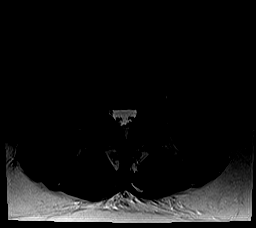
[im 15/33]
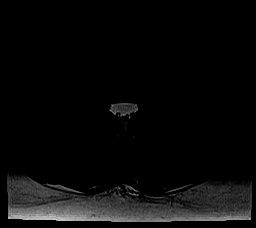
[im 18/33]
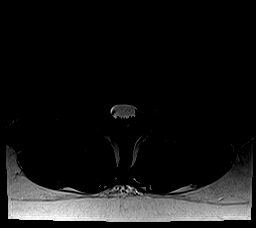
[im 23/33]
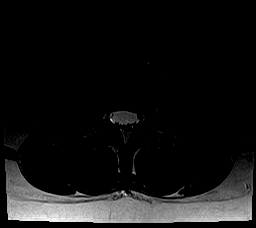
[im 28/33]
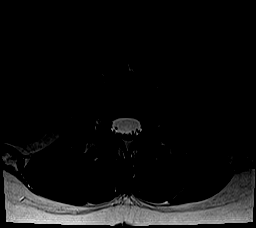
[im 33/33]
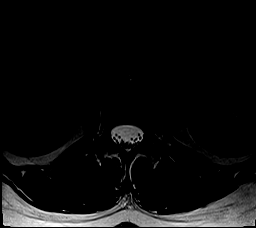

[Series 7: T1 · axial · 4.0mm · 0.35mm/px · z∈[-190,-25]mm · 4 of 33 slices shown (2 of 2)]
[im 1/33]
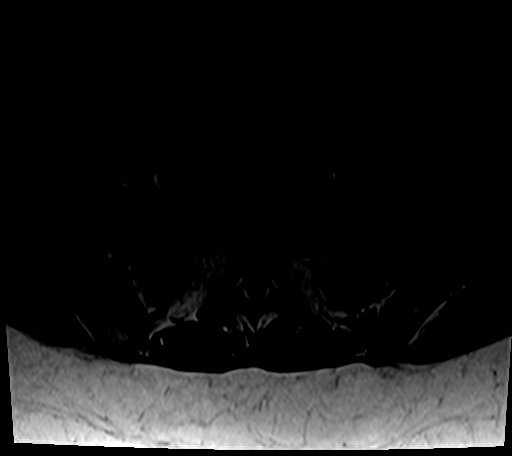
[im 5/33]
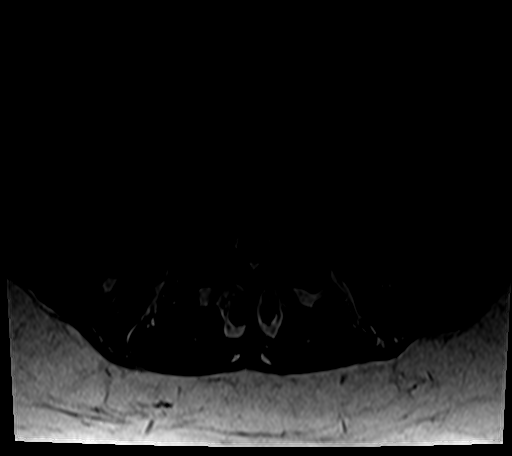
[im 18/33]
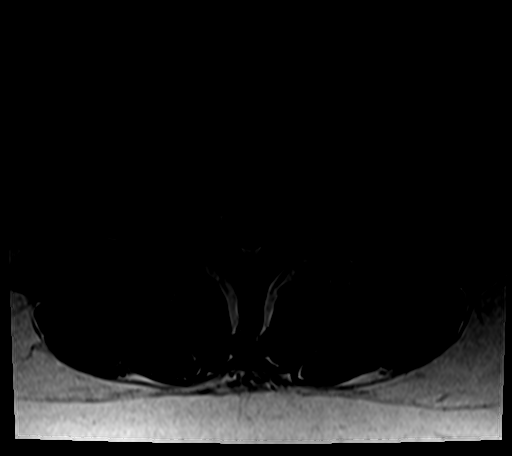
[im 28/33]
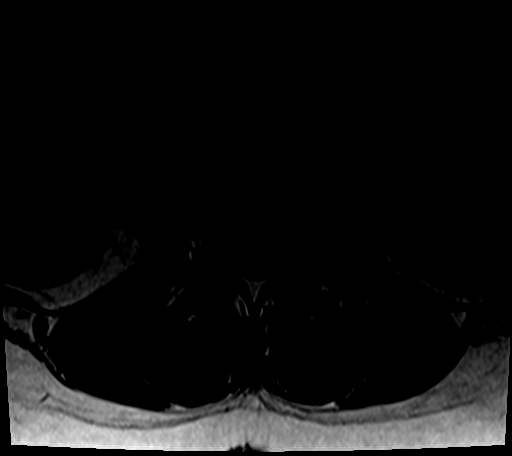

[25 of 48 positions shown; findings below may reference images not displayed]

FINDINGS: Segmentation:  Standard.

Alignment:  Physiologic.

Vertebrae:  No fracture, evidence of discitis, or bone lesion.

Conus medullaris and cauda equina: Conus extends to the T12-L1
level. Conus and cauda equina appear normal.

Paraspinal and other soft tissues: Negative.

Disc levels:

T12-L1:No spinal canal or neural foraminal stenosis.

L1-2:No spinal canal or neural foraminal stenosis.

L2-3:No spinal canal or neural foraminal stenosis.

L3-4:No spinal canal or neural foraminal stenosis.

L4-5:No spinal canal or neural foraminal stenosis.

L5-S1:No spinal canal or neural foraminal stenosis.
IMPRESSION: No significant disc bulge or herniation, spinal canal or neural
foraminal stenosis at any level.

## 2020-11-01 NOTE — Progress Notes (Signed)
Scan looks good.  Have not seen her.  Hilts is someone who has seen her.  Can you have her follow-up with Franky Macho next Friday afternoon and make a plan for either epidural steroid injection or observation.  Thanks

## 2020-11-03 NOTE — Progress Notes (Signed)
Called patient.  Appt made

## 2020-11-06 ENCOUNTER — Encounter: Payer: Self-pay | Admitting: Obstetrics & Gynecology

## 2020-11-06 ENCOUNTER — Other Ambulatory Visit: Payer: Self-pay

## 2020-11-06 ENCOUNTER — Ambulatory Visit (INDEPENDENT_AMBULATORY_CARE_PROVIDER_SITE_OTHER): Payer: 59 | Admitting: Obstetrics & Gynecology

## 2020-11-06 VITALS — BP 116/76

## 2020-11-06 DIAGNOSIS — Z30431 Encounter for routine checking of intrauterine contraceptive device: Secondary | ICD-10-CM | POA: Diagnosis not present

## 2020-11-06 NOTE — Progress Notes (Signed)
    Barbara Noble [birth year] [id number]        30 y.o.  G2P0002 Married.  RP: Mirena IUD check 8 wks post insertion  HPI: Well on Mirena IUD x 09/03/2020.  Had menstrual bleeding yesterday, but didn't see any vaginal blood today.  No pelvic pain.  No pain with IC.  C/O new facial acne.  No fever.   OB History  Gravida Para Term Preterm AB Living  2 2 0 0   2  SAB IAB Ectopic Multiple Live Births          2    # Outcome Date GA Lbr Len/2nd Weight Sex Delivery Anes PTL Lv  2 Para           1 Para             Past medical history,surgical history, problem list, medications, allergies, family history and social history were all reviewed and documented in the EPIC chart.   Directed ROS with pertinent positives and negatives documented in the history of present illness/assessment and plan.  Exam:  Vitals:   11/06/20 1051  BP: 116/76   General appearance:  Normal  Abdomen: Normal  Gynecologic exam: Vulva normal.  Speculum:  Cervix normal, no erythema.  IUD strings visible at the EO.  Very mild brown blood from endocervix.   Assessment/Plan:  30 y.o. G2P0002   1. IUD check up Mirena IUD well-tolerated and in good position.  Complaints of new facial acne.  May be part of PMS.  Patient will note symptoms in the next 3 to 4 months to see if they are cyclic and corresponding to PMS.  Other orders - levonorgestrel (MIRENA) 20 MCG/DAY IUD; 1 each by Intrauterine route once. - Multiple Vitamin (MULTIVITAMIN PO); Take by mouth.   Genia Del MD, 11:00 AM 11/06/2020

## 2020-11-13 ENCOUNTER — Ambulatory Visit: Payer: Self-pay

## 2020-11-13 ENCOUNTER — Other Ambulatory Visit: Payer: Self-pay

## 2020-11-13 ENCOUNTER — Ambulatory Visit (INDEPENDENT_AMBULATORY_CARE_PROVIDER_SITE_OTHER): Payer: 59 | Admitting: Surgical

## 2020-11-13 DIAGNOSIS — M25551 Pain in right hip: Secondary | ICD-10-CM

## 2020-11-13 NOTE — Progress Notes (Signed)
Office Visit Note   Patient: Barbara Noble           Date of Birth: [birth year]           MRN: [id number] Visit Date: 11/13/2020 Requested by: [place], Betty G, MD [id number] [place],  [place] [address] PCP: [place], Betty G, MD  Subjective: Chief Complaint  Patient presents with   Other     Scan review    HPI: Barbara Noble is a 30 y.o. female who presents to the office complaining of pelvic pain.  Patient complains of posterior pelvic pain that she localizes to the right buttocks and sacrum.  She also has pain in the lateral right hip primarily with lateral left hip pain as well to a lesser extent.  She has groin pain that she has noticed more more has more prominent on the right side but present on the left as well.  She notices this pain primarily when bending over and has some difficulty sleeping at night though does not wake her up from sleep.  She has had this pain for several years after her daughter was born 5 years ago but it has been worse with her recent pregnancy less than a year ago.  She has pain on occasion that radiates down to her ankle as well as occasional numbness and tingling at night but these are lesser complaints.  She has no history of steroid use.  No drug use or alcohol use.  She denies any urinary symptoms such as urinary incontinence or any difficulty with painful sexual intercourse.  No mechanical symptoms such as clicking or grinding in the hip.  Denies any weakness in the legs.  She has tried physical therapy for her back without much relief.  She has also tried multiple medications without relief.  She is here today to review MRI scan of the lumbar spine.              ROS: All systems reviewed are negative as they relate to the chief complaint within the history of present illness.  Patient denies fevers or chills.  Assessment & Plan: Visit Diagnoses:  1. Pain in right hip     Plan: Patient is a 30 year old female who presents for evaluation of low  back and pelvic pain with review of lumbar spine MRI.  By her history it seems at least today that most of her pain seems to be associated with her pelvis rather than the lumbar spine.  She has lumbar spine MRI that was reviewed with her today and actually is normal with no significant pathologic findings noted.  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  Lateral center edge angle measured at 45 to 46 degrees.  With continued pain and no significant findings on lumbar spine MRI, as well as increasing suspicion of pain from her pelvis based on her history and radiographs demonstrating pincer lesion, discussed options available to patient mainly between physical therapy devoted to pelvic musculature versus MRI of the pelvis for evaluation of pelvic source of her pain such as labral tear given her groin pain.  She would like to proceed with MRI for now.  I ordered MRI and follow-up after to review results.  Follow-Up Instructions: No follow-ups on file.   Orders:  Orders Placed This Encounter  Procedures   XR HIP UNILAT W OR W/O PELVIS 2-3 VIEWS RIGHT   MR Pelvis w/o contrast  No orders of the defined types were placed in this encounter.     Procedures: No procedures performed   Clinical Data: No additional findings.  Objective: Vital Signs: There were no vitals taken for this visit.  Physical Exam:  Constitutional: Patient appears well-developed HEENT:  Head: Normocephalic Eyes:EOM are normal Neck: Normal range of motion Cardiovascular: Normal rate Pulmonary/chest: Effort normal Neurologic: Patient is alert Skin: Skin is warm Psychiatric: Patient has normal mood and affect  Ortho Exam: Ortho exam demonstrates right hip with increased pain in the groin with terminal hip flexion.  She has mild increased pain with internal rotation of the right hip but none on the left hip.  She has tenderness in the right  groin but no such tenderness over the left groin.  She has bilateral greater trochanter tenderness that she rates moderate.  Excellent hip flexion, quadricep, hamstring, dorsiflexion, plantarflexion strength rated 5/5.  Equivalent patellar tendon reflexes 2+.  Negative straight leg raise bilaterally.  She has tenderness mostly over the SI joint and sacrum on the right-hand side but no significant axial lumbar spine tenderness except for around L5-S1.  Positive Stinchfield sign on exam on the right, negative on the left.  She describes 8/10 pain elicited with positive Stinchfield sign on the right.  Specialty Comments:  No specialty comments available.  Imaging: No results found.   PMFS History: Patient Active Problem List   Diagnosis Date Noted   Fetal abnormality affecting management of mother, antepartum 09/02/2019   Rubella non-immune status, antepartum 08/12/2019   Supervision of other normal pregnancy, antepartum 08/09/2019   History of pre-eclampsia 08/09/2019   Elevated blood pressure affecting pregnancy, antepartum 08/09/2019   History of removal of ovarian cyst 08/09/2019   Late prenatal care @20  wks 08/09/2019   Past Medical History:  Diagnosis Date   Anemia    Hx when younger   History of UTI    In first pregnancy   Infection    History of intestinal infection   Pre-eclampsia    Pregnancy induced hypertension    2017-labor induced    Family History  Problem Relation Age of Onset   Diabetes Mother    Hypertension Mother    Healthy Father    Hypertension Maternal Grandmother    Diabetes Maternal Grandmother     Past Surgical History:  Procedure Laterality Date   APPENDECTOMY     INTRAUTERINE DEVICE (IUD) INSERTION     mirena inserted 09-03-20   OVARIAN CYST REMOVAL     Social History   Occupational History   Not on file  Tobacco Use   Smoking status: Never   Smokeless tobacco: Never  Vaping Use   Vaping Use: Never used  Substance and Sexual Activity    Alcohol use: Not Currently   Drug use: Never   Sexual activity: Yes    Partners: Male    Birth control/protection: I.U.D.

## 2020-11-14 ENCOUNTER — Encounter: Payer: Self-pay | Admitting: Surgical

## 2020-12-08 ENCOUNTER — Other Ambulatory Visit: Payer: Self-pay

## 2020-12-08 ENCOUNTER — Ambulatory Visit (INDEPENDENT_AMBULATORY_CARE_PROVIDER_SITE_OTHER): Payer: 59 | Admitting: Family Medicine

## 2020-12-08 ENCOUNTER — Encounter: Payer: Self-pay | Admitting: Family Medicine

## 2020-12-08 VITALS — BP 128/80 | HR 98 | Resp 12 | Ht 64.75 in | Wt 203.4 lb

## 2020-12-08 DIAGNOSIS — R0981 Nasal congestion: Secondary | ICD-10-CM

## 2020-12-08 DIAGNOSIS — K580 Irritable bowel syndrome with diarrhea: Secondary | ICD-10-CM

## 2020-12-08 DIAGNOSIS — M545 Low back pain, unspecified: Secondary | ICD-10-CM | POA: Diagnosis not present

## 2020-12-08 DIAGNOSIS — R519 Headache, unspecified: Secondary | ICD-10-CM

## 2020-12-08 DIAGNOSIS — Z23 Encounter for immunization: Secondary | ICD-10-CM | POA: Diagnosis not present

## 2020-12-08 DIAGNOSIS — L700 Acne vulgaris: Secondary | ICD-10-CM | POA: Diagnosis not present

## 2020-12-08 DIAGNOSIS — G8929 Other chronic pain: Secondary | ICD-10-CM

## 2020-12-08 HISTORY — DX: Headache, unspecified: R51.9

## 2020-12-08 MED ORDER — TOPIRAMATE 50 MG PO TABS: ORAL_TABLET | ORAL | 1 refills | Status: DC

## 2020-12-08 MED ORDER — MINOCYCLINE HCL 100 MG PO CAPS
100.0000 mg | ORAL_CAPSULE | Freq: Two times a day (BID) | ORAL | 0 refills | Status: DC
Start: 2020-12-08 — End: 2021-01-19

## 2020-12-08 NOTE — Patient Instructions (Addendum)
A few things to remember from today's visit:  Headache, unspecified headache type - Plan: topiramate (TOPAMAX) 50 MG tablet  Acne vulgaris - Plan: minocycline (MINOCIN) 100 MG capsule  Antibiticos para el acn diario 2 veces al da. Topamax lo empieza en la noche, media tab y lo puede aumentar en 2-3 semanas.  If you need refills please call your pharmacy. Do not use My Chart to request refills or for acute issues that need immediate attention.   Please be sure medication list is accurate. If a new problem present, please set up appointment sooner than planned today.  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 El SII no es Doctor, hospital. Para regular la manera en que funciona el tracto GI, el cuerpo enva seales entre los intestinos y Secretary/administrator. Si tiene SII, puede haber un problema con estas seales. Como resultado, el tracto GI no funciona con normalidad. Los intestinos pueden volverse ms sensibles y Publishing rights manager de forma exagerada a determinadas cosas. Esto puede ocurrir especialmente cuando consume determinados alimentos o cuando est bajo estrs. Hay cuatro tipos de SII. Estos tipos pueden determinarse en funcin de la consistencia de la materia fecal (heces): SII con diarrea. SII con estreimiento. SII mixto. SII no tipificado. Es importante saber qu tipo de SII padece. Ciertos tratamientos pueden ser ms tiles para ciertos tipos de SII. Cules son las causas? Se desconoce la causa exacta del SII. Qu incrementa el riesgo? Es posible que tenga un riesgo ms alto de padecer SII si: Es mujer. Es Adult nurse de 40 aos de edad. Tiene antecedentes familiares de SII. Tiene una afeccin de salud mental, como depresin, ansiedad o trastorno de estrs  postraumtico. Tiene una infeccin bacteriana en el tracto GI. Cules son los signos o sntomas? Los sntomas de SII pueden variar de Neomia Dear persona a Educational psychologist. El sntoma principal es el dolor o el malestar abdominal. Otros sntomas incluyen generalmente uno o ms de los siguientes: Diarrea, estreimiento, o ambos. Hinchazn abdominal o meteorismo. Sensacin de saciedad despus de comer poco o una cantidad normal de comida. Gases frecuentes. Mucosidad en las heces. Sensacin de tener ms heces por eliminar despus de una deposicin. Los sntomas suelen aparecer y Geneticist, molecular. Pueden ser causador por el estrs, una afeccin de salud mental o ciertos alimentos. Cmo se diagnostica? Esta afeccin se puede diagnosticar en funcin de un examen fsico, sus antecedentes mdicos y sus sntomas. Pueden hacerle estudios, por ejemplo: Anlisis de Casey. Una prueba de heces. Radiografas. Una exploracin por tomografa computarizada (TC). Colonoscopa. Este es un procedimiento en el que su tracto GI se observa con un tubo largo, delgado y flexible. Cmo se trata? No hay cura para el SII, pero el tratamiento puede ayudar a [employer] sntomas. El tratamiento depende del tipo de SII que tenga, y puede incluir: Cambios en la dieta, por ejemplo: Evitar los alimentos que causan sntomas. Beber ms agua. Seguir una dieta baja en FODMAP (oligosacridos, disacridos, monosacridos y polioles fermentables) por hasta 6 semanas, o segn se lo haya indicado el mdico. Los FODMAP son azcares difciles de digerir para International aid/development worker. Consumir ms fibra. Ingerir comidas de [employer] a la misma hora. Medicamentos. Pueden incluir: Suplementos de fibra, si est estreido. Medicamentos para Landscape architect (antidiarreicos). Medicamentos para ayudar a Acupuncturist (espasmos)  en su tracto GI (antiespasmdicos). Medicamentos para ayudar con las afecciones de salud  mental, como antidepresivos o calmantes. Psicoterapia o apoyo psicolgico. Printmaker con un especialista en dietas y nutricin (nutricionista) para elaborar un plan de comidas que sea adecuado para usted. Controlar el estrs. Siga estas instrucciones en su casa: Comida y bebida Seguir una dieta saludable. Ingiera comidas de tamao mediano alrededor de la misma [employer]. No coma comidas abundantes. Gradualmente introduzca a su dieta ms alimentos ricos en fibra. Estos incluyen cereales integrales, frutas y verduras. Esto puede ser especialmente til si tiene SII con estreimiento. Siga una dieta baja en FODMAP. Beber suficiente lquido como para Pharmacologist la orina de color amarillo plido. Lleve un registro diario de los alimentos que parecen provocarle sntomas. Evite ingerir alimentos y bebidas que: Wellsite geologist. Empeoran sus sntomas. Los productos lcteos, las bebidas con cafena y los refrescos pueden hacer que los sntomas empeoren en [employer]. Instrucciones generales [employer] de venta [place], los recetados y los suplementos solamente como se lo haya indicado el mdico. Ejerctese lo suficiente. Debe realizar al menos 150 minutos de ejercicios de intensidad moderada todas las semanas. Controle el estrs. Dormir lo suficiente y Radio producer ejercicio pueden ayudarlo a Charity fundraiser. Concurra a todas las visitas de [address] se lo hayan indicado el mdico y Training and development officer. Esto es importante. Consumo de alcohol No beba alcohol si: Su mdico le indica no hacerlo. Est embarazada, cree que puede estar embarazada o est tratando de [place]. Si bebe alcohol, limite la cantidad que consume: De 0 a 1 medida por da para las mujeres. De 0 a 2 medidas por da para los hombres. Est atento a la cantidad de alcohol que hay en las bebidas que toma. En los [place], una medida equivale a una botella tpica de cerveza (12 oz/355 ml),  medio vaso de vino (5 oz/148 ml) o un "trago" de bebidas alcohlicas de alta graduacin (1 oz/45 ml). Comunquese con un mdico si tiene: Nurse, children's. Prdida de peso. Dificultad o dolor al tragar. Diarrea que empeora. Solicite ayuda inmediatamente si tiene: Dolor abdominal intenso. [place] Ruts. Diarrea con sntomas de deshidratacin, como mareos o sequedad en la boca. Sangre color rojo brillante en las heces. Heces negras y de aspecto alquitranado. Hinchazn abdominal. Vmitos persistentes. Sangre en el vmito. Resumen El sndrome del intestino irritable (SII) no es Doctor, hospital. Es un grupo de sntomas que afectan la digestin. Los intestinos pueden volverse ms sensibles y Publishing rights manager de forma exagerada a determinadas cosas. Esto puede ocurrir especialmente cuando consume determinados alimentos o cuando est bajo estrs. No hay cura para el SII, pero el tratamiento puede ayudar a [employer] sntomas. Esta informacin no tiene Theme park manager el consejo del mdico. Asegrese de hacerle al mdico cualquier pregunta que tenga. Document Revised: 12/06/2019 Document Reviewed: 10/18/2019 Elsevier Patient Education  2022 [employer].

## 2020-12-08 NOTE — Assessment & Plan Note (Signed)
We reviewed possible etiologies. She has history of migraines but recent headache suggest more tension-like headache. Amitriptyline did not help. She agrees with trying topiramate 50 mg, recommend starting with 1/2 tablet and increase it to a whole tablet if well-tolerated in 2 to 3 weeks. Start a headache diary. I do not think imaging is needed at this time. Instructed about warning signs. Follow-up in 4 to 5 weeks, before if needed.

## 2020-12-08 NOTE — Assessment & Plan Note (Signed)
Topical treatments have not helped. IUD Mirena could be a contributing factor. She agrees with trying oral antibiotic, minocycline 100 mg twice daily. Continue topical benzoyl peroxide. We discussed some side effects of medications. Follow-up in 4 to 5 weeks, if not better we will consider dermatology referral.

## 2020-12-08 NOTE — Progress Notes (Signed)
Chief Complaint  Patient presents with   Pain   HPI: Barbara Noble is a 30 y.o. female, who is here today with a few complaints. She was last seen on 08/07/20. Still having lower back pain, right sided radiated to RLE. Pain is exacerbated by activities that involve bending,prolonged walking/standing. She has seen ortho. According to pt, she had lumbar MRI and it was fine. Right hip MRI is pending.  Years of periumbilical abdominal pain. Cramps 10 min after eating, urgency,and loose stool. She has not  identified foods that aggravate problem, it happens usually after liunch. She has no symptoms after breakfast or supper. He stopped milk, which was also causing GI symptoms.  Started probiotic this past week and it has helped, Dermala. She wonders if there is something similar covered by insurance. Negative for associated fever,chills,night sweats, abnormal wt loss, N/V, melena,or blood in stool.  Headaches improved 50%, tylenol does not help. Problem has been going on for over 10 months. Headaches 2/week, frontal-parietal, dull. constant, it can be up to 9/10,and it lasts 24 hours. Alleviated by taking a nap. Sometimes she has associated nausea but no vomiting or photophobia. Tylenol does not help. Years ago she was dx'ed with migraine but headaches at that time were associated with N/V,phonophobia,and photophobia. Amitriptyline did not help.  She has not had menses since IUD mirena was placed, initially she had some spotting. She is concerned about not having menses, afraid of long term negative effects. Next appt with gyn in 02/2021.  Increased acne: Face and neck. Problem noted after IUD. She tried Benzoyl Peroxide and adapalene but medications have not helped with preventing new lesions from  has not helped.  Frequent colds: Intermittent episodes of productive cough, nasal congestion,and rhinorrhea. No associated eye or nose pruritus. No fever,chills,body aches,sore  throat,wheezing,or SOB. Daughter attends school, when she gets frequent respiratory symptoms. She takes Advil cold and sinus. No hx of allergies.  Review of Systems  Constitutional:  Negative for activity change, appetite change and fatigue.  HENT:  Negative for facial swelling and mouth sores.   Eyes:  Negative for discharge and redness.  Cardiovascular:  Negative for chest pain and palpitations.  Endocrine: Negative for cold intolerance and heat intolerance.  Musculoskeletal:  Positive for arthralgias and back pain.  Neurological:  Negative for syncope, facial asymmetry and weakness.  Hematological:  Negative for adenopathy. Does not bruise/bleed easily.  Psychiatric/Behavioral:  The patient is nervous/anxious.   Rest see pertinent positives and negatives per HPI.  Current Outpatient Medications on File Prior to Visit  Medication Sig Dispense Refill   acetaminophen (TYLENOL) 500 MG tablet Take by mouth.     acetic acid-hydrocortisone (VOSOL-HC) OTIC solution Place 3 drops into the left ear 2 (two) times daily as needed. 10 mL 0   levonorgestrel (MIRENA) 20 MCG/DAY IUD 1 each by Intrauterine route once.     Multiple Vitamin (MULTIVITAMIN PO) Take by mouth.     No current facility-administered medications on file prior to visit.   Past Medical History:  Diagnosis Date   Anemia    Hx when younger   Headache, unspecified headache type 12/08/2020   History of UTI    In first pregnancy   Infection    History of intestinal infection   Pre-eclampsia    Pregnancy induced hypertension    2017-labor induced   No Known Allergies  Social History   Socioeconomic History   Marital status: Married    Spouse name: Sports coach   Number  of children: 1   Years of education: 14   Highest education level: Not on file  Occupational History   Not on file  Tobacco Use   Smoking status: Never   Smokeless tobacco: Never  Vaping Use   Vaping Use: Never used  Substance and Sexual Activity    Alcohol use: Not Currently   Drug use: Never   Sexual activity: Yes    Partners: Male    Birth control/protection: I.U.D.  Other Topics Concern   Not on file  Social History Narrative   Not on file   Social Determinants of Health   Financial Resource Strain: Not on file  Food Insecurity: Not on file  Transportation Needs: Not on file  Physical Activity: Not on file  Stress: Not on file  Social Connections: Not on file   Vitals:   12/08/20 1133  BP: 128/80  Pulse: 98  Resp: 12  SpO2: 97%   Body mass index is 34.11 kg/m.  Physical Exam Vitals and nursing note reviewed.  Constitutional:      General: She is not in acute distress.    Appearance: She is well-developed.  HENT:     Head: Normocephalic and atraumatic.     Mouth/Throat:     Mouth: Mucous membranes are moist.     Pharynx: Oropharynx is clear.  Eyes:     Conjunctiva/sclera: Conjunctivae normal.  Cardiovascular:     Rate and Rhythm: Normal rate and regular rhythm.     Heart sounds: No murmur heard. Pulmonary:     Effort: Pulmonary effort is normal. No respiratory distress.     Breath sounds: Normal breath sounds.  Abdominal:     General: There is no distension.     Palpations: Abdomen is soft. There is no hepatomegaly or mass.     Tenderness: There is abdominal tenderness. There is no guarding or rebound.  Lymphadenopathy:     Cervical: No cervical adenopathy.  Skin:    General: Skin is warm.     Findings: Rash present. No erythema. Rash is papular and pustular.     Comments: Erythematous papular and pustular lesions submandibular,jaw,and cheeks.  Neurological:     General: No focal deficit present.     Mental Status: She is alert and oriented to person, place, and time.     Cranial Nerves: No cranial nerve deficit.     Gait: Gait normal.  Psychiatric:        Mood and Affect: Mood is anxious.     Comments: Well groomed, good eye contact.   ASSESSMENT AND PLAN:  Barbara Noble was seen today for  pain.  Diagnoses and all orders for this visit:  Headache, unspecified headache type We reviewed possible etiologies. She has history of migraines but recent headache suggest more tension-like headache. Amitriptyline did not help. She agrees with trying topiramate 50 mg, recommend starting with 1/2 tablet and increase it to a whole tablet if well-tolerated in 2 to 3 weeks. Start a headache diary. I do not think imaging is needed at this time. Instructed about warning signs. Follow-up in 4 to 5 weeks, before if needed.  Acne vulgaris Topical treatments have not helped. IUD Mirena could be a contributing factor. She agrees with trying oral antibiotic, minocycline 100 mg twice daily. Continue topical benzoyl peroxide. We discussed some side effects of medications. Follow-up in 4 to 5 weeks, if not better we will consider dermatology referral.  Irritable bowel syndrome with diarrhea We discussed possible etiologies. Hx and examination  do no suggest a serious process. It has improved with pre-probiotic supplementation, so continue it. Bland/light meals. I do not think GI referral is needed at this time but it was offered, she prefers to hold on it.  Chronic right-sided low back pain, unspecified whether sciatica present Lumbar MRI done on 10/29/20 showed  no significant disc bulge or herniation, spinal canal or neural foraminal stenosis at any level. According to pt, hip MRI is being arranged. Continue following with ortho.  Nasal congestion Frequent cold-like symptoms she is reporting could be caused by allergies. OTC antihistaminic and intranasal steroid may help. Side effects of NSAID's discussed.  Need for influenza vaccination -     Flu Vaccine QUAD 19mo+IM (Fluarix, Fluzone & Alfiuria Quad PF)  I spent a total of 44 minutes in both face to face and non face to face activities for this visit on the date of this encounter. During this time history was obtained and documented,  examination was performed, prior labs/imaging reviewed, and assessment/plan discussed. Reassured in regard not having menses after Mirena placement. Continue following with gyn.  Return in about 5 weeks (around 01/12/2021).   Omega Durante G. [place], MD  [facility]. [place] office.

## 2020-12-09 ENCOUNTER — Ambulatory Visit
Admission: RE | Admit: 2020-12-09 | Discharge: 2020-12-09 | Disposition: A | Discharge status: Home / Self Care | Payer: 59 | Source: Ambulatory Visit | Attending: Surgical | Admitting: Surgical

## 2020-12-09 DIAGNOSIS — M25551 Pain in right hip: Secondary | ICD-10-CM

## 2020-12-09 IMAGING — MR MR PELVIS W/O CM
4 of 5 series · 30 of 48 positions shown · non-contrast
Comparison: None.

CLINICAL DATA: Low back pain with right hip pain for 1 year. No
known injury or surgery.

EXAM:
MRI PELVIS WITHOUT CONTRAST
TECHNIQUE: Multiplanar multisequence MR imaging of the pelvis was performed. No
intravenous contrast was administered.

[Series 3: T1 · coronal · 4.0mm · 1.56mm/px · 7 of 36 slices shown (1 of 2)]
[im 1/36]
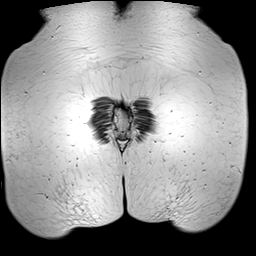
[im 6/36]
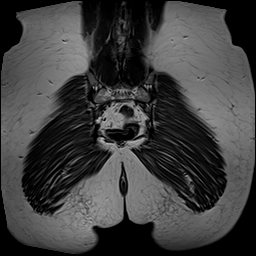
[im 12/36]
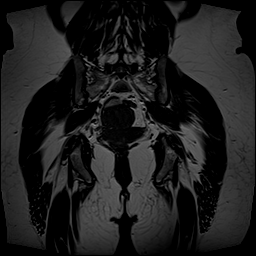
[im 18/36]
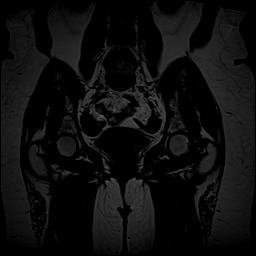
[im 24/36]
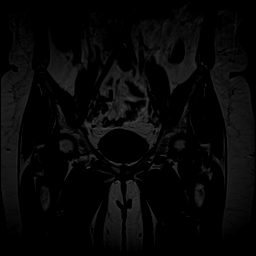
[im 30/36]
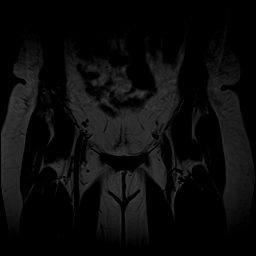
[im 36/36]
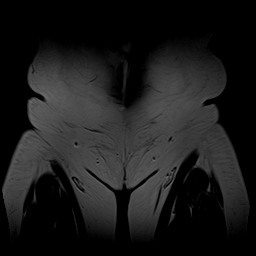

[Series 4: STIR · coronal · 4.0mm · 1.56mm/px · 8 of 36 slices shown]
[im 1/36]
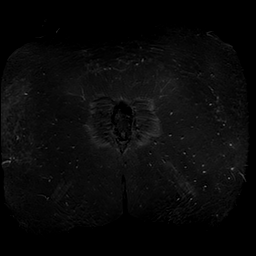
[im 6/36]
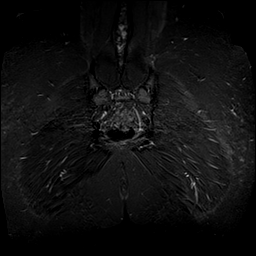
[im 11/36]
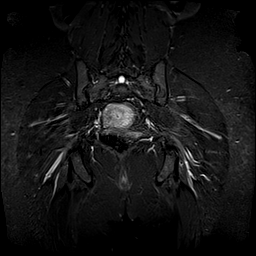
[im 16/36]
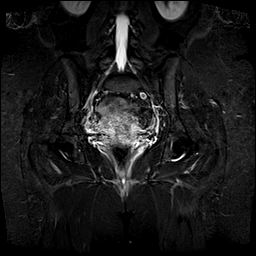
[im 21/36]
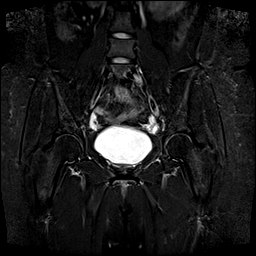
[im 26/36]
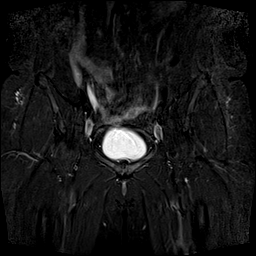
[im 31/36]
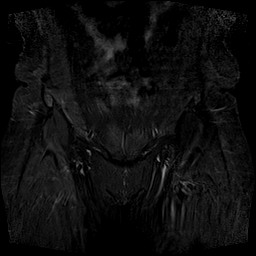
[im 36/36]
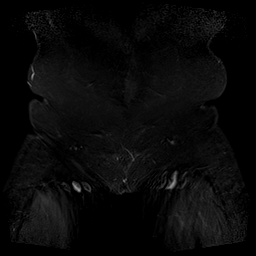

[Series 5: T2 fat-sat · sagittal · 4.0mm · 0.47mm/px · 9 of 64 slices shown]
[im 1/64]
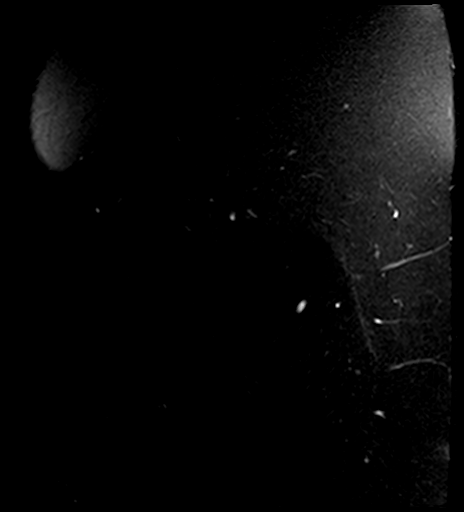
[im 11/64]
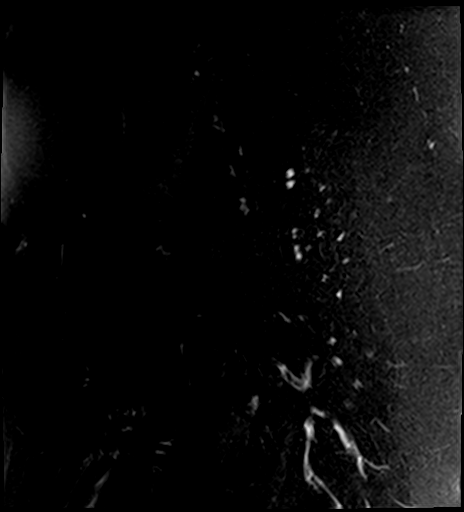
[im 22/64]
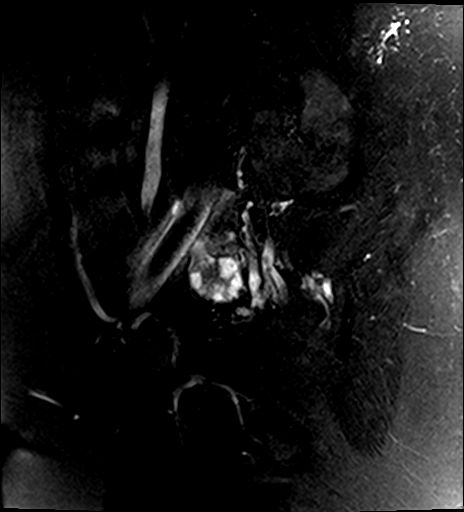
[im 27/64]
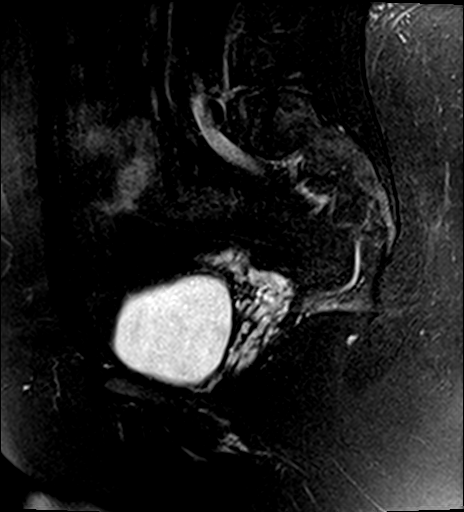
[im 32/64]
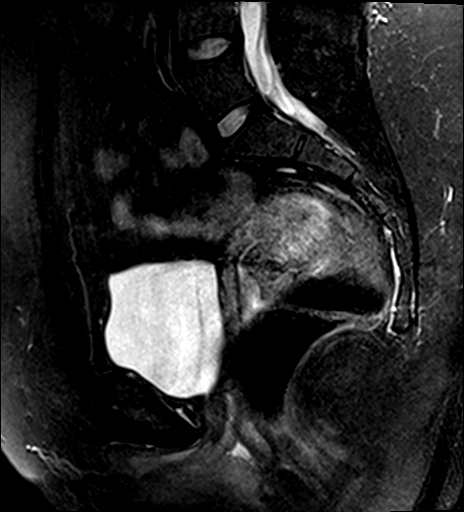
[im 37/64]
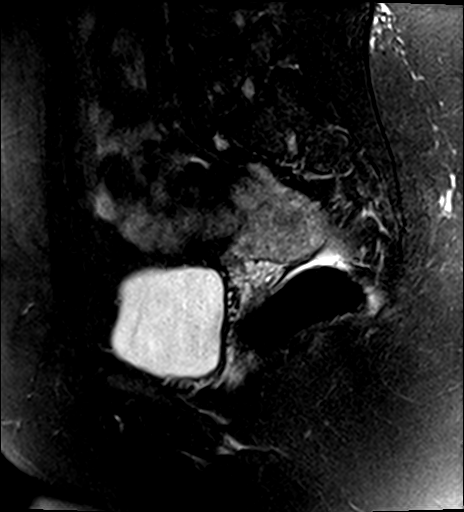
[im 43/64]
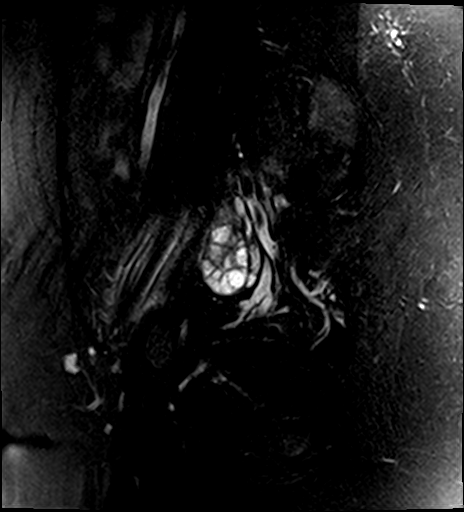
[im 53/64]
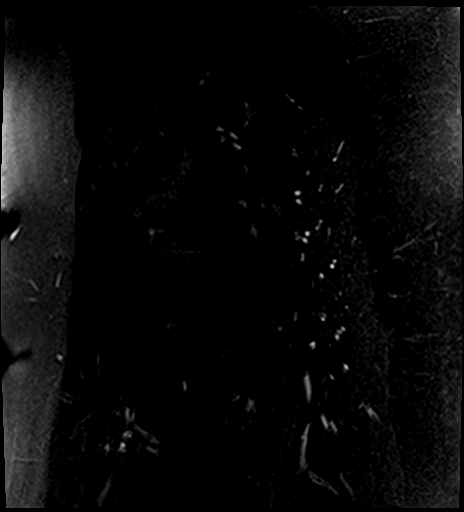
[im 64/64]
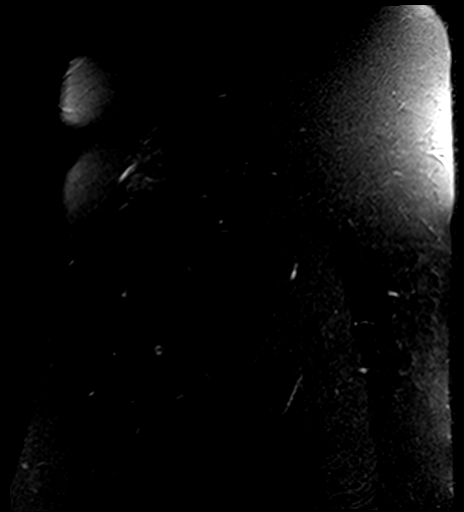

[Series 6: T1 · axial · 4.0mm · 0.66mm/px · z∈[-83,+117]mm · 6 of 46 slices shown (2 of 2)]
[im 1/46]
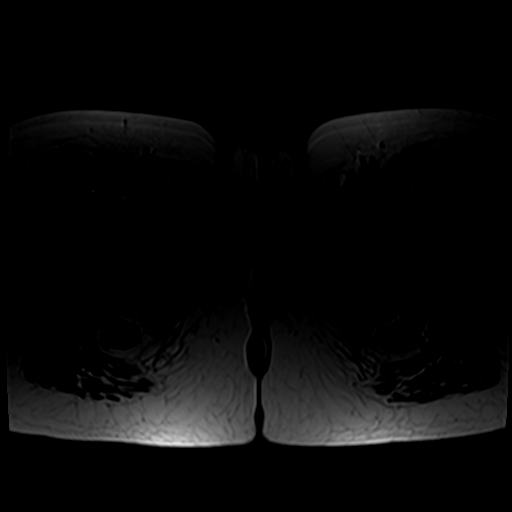
[im 6/46]
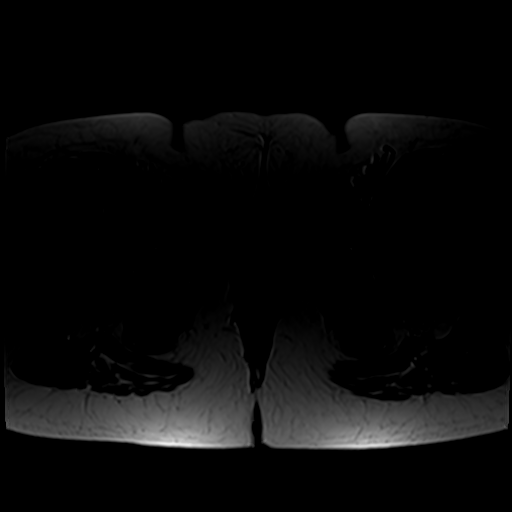
[im 16/46]
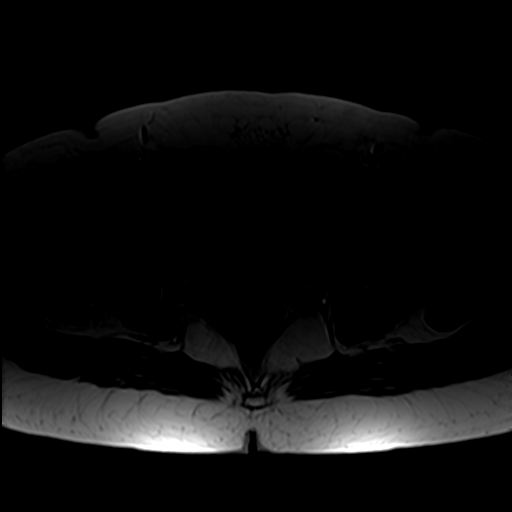
[im 21/46]
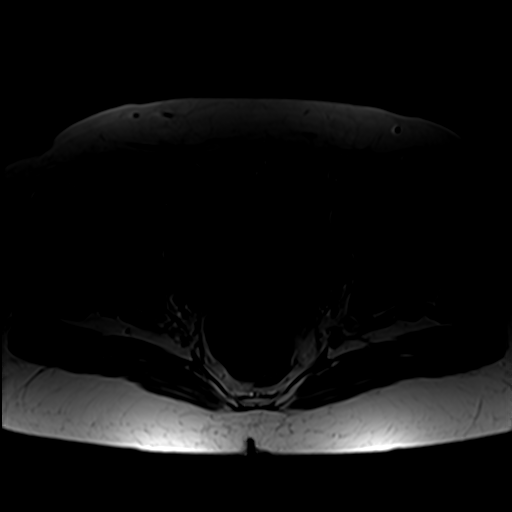
[im 26/46]
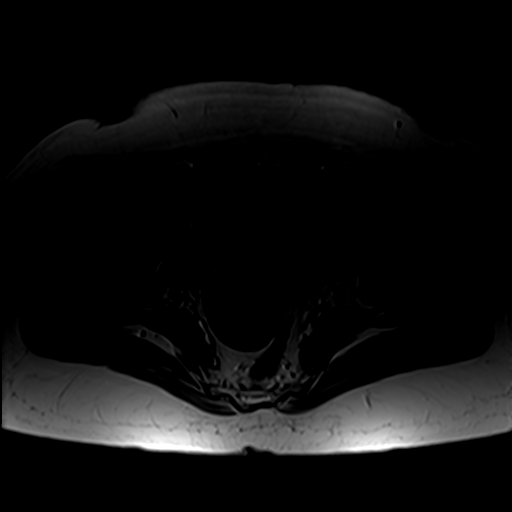
[im 41/46]
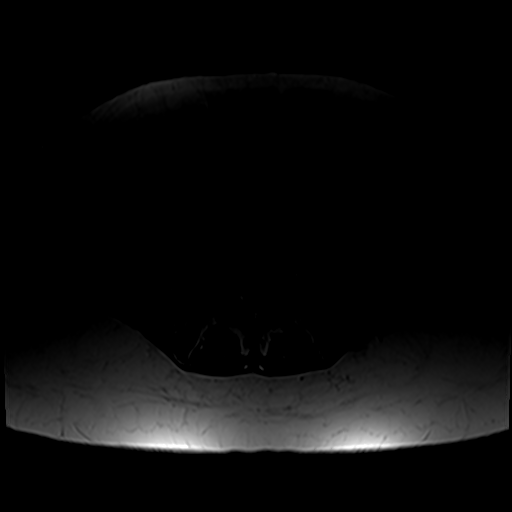

[30 of 48 positions shown; findings below may reference images not displayed]

FINDINGS: Bones/Joint/Cartilage

No acute fracture. No dislocation. No femoral head avascular
necrosis. Bony pelvis intact without diastasis. Bilateral hip joints
are intact. No evidence of focal cartilage defect or gross labral
abnormality on large field of view non-arthrographic imaging. No
paralabral cyst. Mild acetabular over-coverage bilaterally with
slightly decreased offset of the bilateral femoral head-neck
junctions. No subchondral marrow signal changes. SI joints and pubic
symphysis within normal limits. The visualized portion the lower
lumbar spine is within normal limits without significant
spondylosis. No bone marrow edema. No marrow replacing bone lesion.

Ligaments

Intact.

Muscles and Tendons

The gluteal, hamstring, iliopsoas, rectus femoris, and adductor
tendons appear intact without tear or significant tendinosis. No
abnormal bursal fluid collection. Normal muscle bulk and signal
intensity without edema, atrophy, or fatty infiltration.

Soft tissues

No soft tissue edema or fluid collection. No inguinal
lymphadenopathy. No acute findings are seen within the pelvis.
IMPRESSION: 1. Osseous morphology of the bilateral hips which may predispose to
femoroacetabular impingement in the appropriate clinical setting.
2. Otherwise unremarkable MRI of the pelvis.

## 2021-01-11 NOTE — Progress Notes (Signed)
HPI: [name] is a 30 y.o. female, who is here today to follow on recent OV. She was last seen on 12/08/20, when she was complaining about frequent headache, least twice per week.  Last visit Topamax 50 mg was started, she was instructed to start with half tablet and to increase dose if well-tolerated. She did not increase dose, she is taking Topamax 25 mg daily at bedtime.  She has noted great improvement in her headaches, she has had one headache since her last visit, did not last long. Tylenol x1 helped. In general she has tolerated medication well, she has noted occasional tingling like sensation on lower extremities when she is in bed and "little" decreased in appetite.  Negative for visual changes, photophobia, nausea, vomiting, MS changes, or focal deficit.  15 days ago she had heavy vaginal bleeding, wonders if this is caused by new medication. She is having intermittent vaginal bleeding for a few months. Mild pelvic cramps. She follows with gynecologist, next appointment 01/19/2021. She has IUD. LMP 12/29/20.  Vulgar acne: Minocycline 100 mg twice daily did not help. She has tried different OTC medications: Benzoyl peroxide,topical abx, and adapalene.  Review of Systems  Constitutional:  Negative for activity change, chills and fever.  HENT:  Negative for nosebleeds and sore throat.   Respiratory:  Negative for cough, shortness of breath and wheezing.   Cardiovascular:  Negative for chest pain, palpitations and leg swelling.  Gastrointestinal:        Negative for changes in bowel habits.  Genitourinary:  Negative for decreased urine volume and hematuria.  Neurological:  Negative for syncope and facial asymmetry.  Psychiatric/Behavioral:  Negative for confusion. The patient is nervous/anxious.   Rest see pertinent positives and negatives per HPI.  Current Outpatient Medications on File Prior to Visit  Medication Sig Dispense Refill   acetaminophen (TYLENOL) 500  MG tablet Take by mouth.     acetic acid-hydrocortisone (VOSOL-HC) OTIC solution Place 3 drops into the left ear 2 (two) times daily as needed. 10 mL 0   levonorgestrel (MIRENA) 20 MCG/DAY IUD 1 each by Intrauterine route once.     minocycline (MINOCIN) 100 MG capsule Take 1 capsule (100 mg total) by mouth 2 (two) times daily. 60 capsule 0   Multiple Vitamin (MULTIVITAMIN PO) Take by mouth.     No current facility-administered medications on file prior to visit.   Past Medical History:  Diagnosis Date   Anemia    Hx when younger   Headache, unspecified headache type 12/08/2020   History of UTI    In first pregnancy   Infection    History of intestinal infection   Pre-eclampsia    Pregnancy induced hypertension    2017-labor induced   No Known Allergies  Social History   Socioeconomic History   Marital status: Married    Spouse name: Sports coach   Number of children: 1   Years of education: 14   Highest education level: Not on file  Occupational History   Not on file  Tobacco Use   Smoking status: Never   Smokeless tobacco: Never  Vaping Use   Vaping Use: Never used  Substance and Sexual Activity   Alcohol use: Not Currently   Drug use: Never   Sexual activity: Yes    Partners: Male    Birth control/protection: I.U.D.  Other Topics Concern   Not on file  Social History Narrative   Not on file   Social Determinants of Health  Financial Resource Strain: Not on file  Food Insecurity: Not on file  Transportation Needs: Not on file  Physical Activity: Not on file  Stress: Not on file  Social Connections: Not on file   Vitals:   01/12/21 1133  BP: 122/80  Pulse: 95  Resp: 16  Temp: 98.5 F (36.9 C)  SpO2: 98%   Body mass index is 34.31 kg/m.  Physical Exam Vitals and nursing note reviewed.  Constitutional:      General: She is not in acute distress.    Appearance: She is well-developed.  HENT:     Head: Normocephalic and atraumatic.  Eyes:      Conjunctiva/sclera: Conjunctivae normal.  Cardiovascular:     Rate and Rhythm: Normal rate and regular rhythm.     Heart sounds: No murmur heard. Pulmonary:     Effort: Pulmonary effort is normal. No respiratory distress.     Breath sounds: Normal breath sounds.  Abdominal:     Palpations: Abdomen is soft. There is no mass.     Tenderness: There is no abdominal tenderness.  Skin:    General: Skin is warm.     Findings: Rash (Face and neck acne) present. No erythema. Rash is papular and pustular.  Neurological:     General: No focal deficit present.     Mental Status: She is alert and oriented to person, place, and time.     Cranial Nerves: No cranial nerve deficit.     Gait: Gait normal.  Psychiatric:     Comments: Well groomed, good eye contact.   ASSESSMENT AND PLAN:  [name] was seen today for follow-up.  Diagnoses and all orders for this visit: Orders Placed This Encounter  Procedures   Ambulatory referral to Dermatology   POCT urine pregnancy   Headache, unspecified headache type Problem has greatly improved. Continue topiramate 25 mg daily. We discussed some side effects. Instructed about warning signs. Follow-up in 6 months, before if needed.  -     topiramate (TOPAMAX) 25 MG tablet; Take 1 tablet (25 mg total) by mouth daily.  Acne vulgaris Oral antibiotic did not help and she has tried different topical medications. Dermatology referral placed.  DUB (dysfunctional uterine bleeding) Negative pregnancy test here in the office. Keep appointment with gynecologist. Instructed about warning signs.  Return in about 6 months (around 07/12/2021).  Kewan Mcnease G. [place], MD  [facility]. [place] office.

## 2021-01-12 ENCOUNTER — Encounter: Payer: Self-pay | Admitting: Family Medicine

## 2021-01-12 ENCOUNTER — Ambulatory Visit (INDEPENDENT_AMBULATORY_CARE_PROVIDER_SITE_OTHER): Payer: BC Managed Care – PPO | Admitting: Family Medicine

## 2021-01-12 VITALS — BP 122/80 | HR 95 | Temp 98.5°F | Resp 16 | Ht 64.75 in | Wt 204.6 lb

## 2021-01-12 DIAGNOSIS — R519 Headache, unspecified: Secondary | ICD-10-CM | POA: Diagnosis not present

## 2021-01-12 DIAGNOSIS — N938 Other specified abnormal uterine and vaginal bleeding: Secondary | ICD-10-CM

## 2021-01-12 DIAGNOSIS — L700 Acne vulgaris: Secondary | ICD-10-CM

## 2021-01-12 LAB — POCT URINE PREGNANCY: Preg Test, Ur: NEGATIVE

## 2021-01-12 MED ORDER — TOPIRAMATE 25 MG PO TABS
25.0000 mg | ORAL_TABLET | Freq: Two times a day (BID) | ORAL | 1 refills | Status: DC
Start: 2021-01-12 — End: 2021-01-12

## 2021-01-12 MED ORDER — TOPIRAMATE 25 MG PO TABS: 25.0000 mg | ORAL_TABLET | Freq: Every day | ORAL | 1 refills | Status: DC

## 2021-01-12 NOTE — Patient Instructions (Addendum)
A few things to remember from today's visit:  Acne vulgaris - Plan: Ambulatory referral to Dermatology  Headache, unspecified headache type - Plan: topiramate (TOPAMAX) 25 MG tablet  DUB (dysfunctional uterine bleeding)  If you need refills please call your pharmacy. Do not use My Chart to request refills or for acute issues that need immediate attention.  Please be sure medication list is accurate. If a new problem present, please set up appointment sooner than planned today.  Continue Topamax 25 mg en la noche. Cita con el dermatologo pendiente. Mantenga la cita con su ginecologo.

## 2021-01-13 ENCOUNTER — Ambulatory Visit: Payer: 59 | Admitting: Orthopedic Surgery

## 2021-01-13 ENCOUNTER — Ambulatory Visit (INDEPENDENT_AMBULATORY_CARE_PROVIDER_SITE_OTHER): Payer: BC Managed Care – PPO | Admitting: Surgical

## 2021-01-13 ENCOUNTER — Other Ambulatory Visit: Payer: Self-pay

## 2021-01-13 DIAGNOSIS — M25551 Pain in right hip: Secondary | ICD-10-CM | POA: Diagnosis not present

## 2021-01-15 ENCOUNTER — Encounter: Payer: Self-pay | Admitting: Orthopedic Surgery

## 2021-01-15 NOTE — Progress Notes (Signed)
Office Visit Note   Patient: Barbara Noble           Date of Birth: 1991/01/15           MRN: [id number] Visit Date: 01/13/2021 Requested by: [place], Betty G, MD [address] [place],  [place] [address] PCP: [place], Betty G, MD  Subjective: Chief Complaint  Patient presents with   Other     Scan review    HPI: Barbara Noble is a 30 y.o. female who presents to the office for MRI review. Patient denies any changes in symptoms.  Continues to complain mainly of posterior pelvic, low back, groin, lateral hip pain.  This is all been worse since her recent pregnancy and all started with her first pregnancy about 5 years ago.    MRI results revealed: IMPRESSION: 1. Osseous morphology of the bilateral hips which may predispose to femoroacetabular impingement in the appropriate clinical setting. 2. Otherwise unremarkable MRI of the pelvis.               ROS: All systems reviewed are negative as they relate to the chief complaint within the history of present illness.  Patient denies fevers or chills.  Assessment & Plan: Visit Diagnoses:  1. Pain in right hip     Plan: Barbara Noble is a 30 y.o. female who presents to the office to review MRI pelvis.  Pelvic MRI negative for any occult arthritis, labral pathology, gluteal tendinopathy.  She continues to endorse the same symptoms but on exam today her most compelling exam finding is tenderness over the right greater trochanter.  Discussed options available to patient's.  She has had MRI lumbar spine and MRI pelvis that have been negative for any pathology that is identifiable or treatable by surgery.  Options for her now would include lumbar spine ESI versus physical therapy versus greater trochanter injection versus living with her pain.  After discussion of options, patient would like to try physical therapy devoted to her pelvic musculature as well as IT band stretching.  Plan to try this for 6 to 8 weeks and then return for repeat  evaluation.  If no improvement, consider right greater trochanter injection at that time.  Patient agreed with plan.  Follow-up in 2 months.  Follow-Up Instructions: No follow-ups on file.   Orders:  Orders Placed This Encounter  Procedures   Ambulatory referral to Physical Therapy   No orders of the defined types were placed in this encounter.     Procedures: No procedures performed   Clinical Data: No additional findings.  Objective: Vital Signs: There were no vitals taken for this visit.  Physical Exam:  Constitutional: Patient appears well-developed HEENT:  Head: Normocephalic Eyes:EOM are normal Neck: Normal range of motion Cardiovascular: Normal rate Pulmonary/chest: Effort normal Neurologic: Patient is alert Skin: Skin is warm Psychiatric: Patient has normal mood and affect  Ortho Exam: Ortho exam demonstrates no significant pain with hip range of motion with internal rotation but she does have a little bit of increased pain in her groin with terminal hip flexion.  Overall good range of motion bilaterally.  She has some nonfocal diffuse tenderness throughout the axial lumbar spine and paraspinal musculature.  Intact hip flexion and hip internal rotation actively.  Good strength in internal rotation with no real reproduced pain.  She does have moderate tenderness to palpation over the right greater trochanter and mild tenderness to palpation over the left greater trochanter.  Pain with hip AB duction reproduces lateral  hip pain on the right side but not the left.  Negative straight leg raise bilaterally.  No evidence of clonus.   Specialty Comments:  No specialty comments available.  Imaging: No results found.   PMFS History: Patient Active Problem List   Diagnosis Date Noted   Headache, unspecified headache type 12/08/2020   Acne vulgaris 12/08/2020   Fetal abnormality affecting management of mother, antepartum 09/02/2019   Rubella non-immune status,  antepartum 08/12/2019   Supervision of other normal pregnancy, antepartum 08/09/2019   History of pre-eclampsia 08/09/2019   Elevated blood pressure affecting pregnancy, antepartum 08/09/2019   History of removal of ovarian cyst 08/09/2019   Late prenatal care @20  wks 08/09/2019   Past Medical History:  Diagnosis Date   Anemia    Hx when younger   Headache, unspecified headache type 12/08/2020   History of UTI    In first pregnancy   Infection    History of intestinal infection   Pre-eclampsia    Pregnancy induced hypertension    2017-labor induced    Family History  Problem Relation Age of Onset   Diabetes Mother    Hypertension Mother    Healthy Father    Hypertension Maternal Grandmother    Diabetes Maternal Grandmother     Past Surgical History:  Procedure Laterality Date   APPENDECTOMY     INTRAUTERINE DEVICE (IUD) INSERTION     mirena inserted 09-03-20   OVARIAN CYST REMOVAL     Social History   Occupational History   Not on file  Tobacco Use   Smoking status: Never   Smokeless tobacco: Never  Vaping Use   Vaping Use: Never used  Substance and Sexual Activity   Alcohol use: Not Currently   Drug use: Never   Sexual activity: Yes    Partners: Male    Birth control/protection: I.U.D.

## 2021-01-19 ENCOUNTER — Other Ambulatory Visit: Payer: Self-pay

## 2021-01-19 ENCOUNTER — Encounter: Payer: Self-pay | Admitting: Obstetrics & Gynecology

## 2021-01-19 ENCOUNTER — Ambulatory Visit (INDEPENDENT_AMBULATORY_CARE_PROVIDER_SITE_OTHER): Payer: BC Managed Care – PPO | Admitting: Obstetrics & Gynecology

## 2021-01-19 VITALS — BP 110/80

## 2021-01-19 DIAGNOSIS — Z30011 Encounter for initial prescription of contraceptive pills: Secondary | ICD-10-CM | POA: Diagnosis not present

## 2021-01-19 DIAGNOSIS — Z30432 Encounter for removal of intrauterine contraceptive device: Secondary | ICD-10-CM | POA: Diagnosis not present

## 2021-01-19 DIAGNOSIS — N921 Excessive and frequent menstruation with irregular cycle: Secondary | ICD-10-CM | POA: Diagnosis not present

## 2021-01-19 DIAGNOSIS — Z975 Presence of (intrauterine) contraceptive device: Secondary | ICD-10-CM

## 2021-01-19 MED ORDER — DROSPIRENONE-ETHINYL ESTRADIOL 3-0.03 MG PO TABS
1.0000 | ORAL_TABLET | Freq: Every day | ORAL | 4 refills | Status: DC
Start: 2021-01-19 — End: 2021-08-30

## 2021-01-19 NOTE — Progress Notes (Signed)
Halleigh Comes [birth year] [id number]   History:    30 y.o. G2P2L2 Married.  Children 6 and 1 yo.  From [place], [place].   RP: Episodes of heavy bleeding on Mirena IUD, would like to change contraception   HPI: On Mirena IUD x 08/2020.  3 episodes of very heavy vaginal bleeding x Mirena IUD insertion.  Increased facial acne.  Would like to change to a BCP which helps acne, did well on [place] in [place].  Past medical history,surgical history, family history and social history were all reviewed and documented in the EPIC chart.  Gynecologic History No LMP recorded. (Menstrual status: IUD).  Obstetric History OB History  Gravida Para Term Preterm AB Living  2 2 0 0   2  SAB IAB Ectopic Multiple Live Births          2    # Outcome Date GA Lbr Len/2nd Weight Sex Delivery Anes PTL Lv  2 Para           1 Para              ROS: A ROS was performed and pertinent positives and negatives are included in the history.  GENERAL: No fevers or chills. HEENT: No change in vision, no earache, sore throat or sinus congestion. NECK: No pain or stiffness. CARDIOVASCULAR: No chest pain or pressure. No palpitations. PULMONARY: No shortness of breath, cough or wheeze. GASTROINTESTINAL: No abdominal pain, nausea, vomiting or diarrhea, melena or bright red blood per rectum. GENITOURINARY: No urinary frequency, urgency, hesitancy or dysuria. MUSCULOSKELETAL: No joint or muscle pain, no back pain, no recent trauma. DERMATOLOGIC: No rash, no itching, no lesions. ENDOCRINE: No polyuria, polydipsia, no heat or cold intolerance. No recent change in weight. HEMATOLOGICAL: No anemia or easy bruising or bleeding. NEUROLOGIC: No headache, seizures, numbness, tingling or weakness. PSYCHIATRIC: No depression, no loss of interest in normal activity or change in sleep pattern.     Exam:  BP 110/80   General appearance : Well developed well nourished female. No acute distress  Informed written consent obtained for  Mirena IUD removal.  Mirena IUD removal:  Vulva normal.  Speculum inserted in the Vagina: No gross lesions or discharge.  Cervix: No gross lesions or discharge.  IUD strings visible at the EO.  IUD strings grasped with a Fenestrated clamp.  IUD easily removed, complete and intact.  IUD shown to patient and discarded.  Speculum removed.  No Cx.  Well tolerated by patient. Bimanual exam:  Uterus  AV, normal size, shape and consistency, non-tender and mobile.  Adnexa  Without masses or tenderness.  Anus: Normal   Assessment/Plan:  30 y.o. female for annual exam   1. Breakthrough bleeding associated with intrauterine device (IUD) On Mirena IUD x 08/2020.  3 episodes of very heavy vaginal bleeding x Mirena IUD insertion.  Increased facial acne.  Would like to change to a BCP which helps acne, did well on [place] in [place].  No contraindication to birth control pills.  Usage reviewed with patient: As well as risks and benefits.  Generic of Yasmin prescription sent to pharmacy.  Patient will start on the pill today.  Follow-up for pelvic ultrasound to further investigate the heavy irregular bleeding. - US Transvaginal Non-OB; Future  2. Encounter for IUD removal Mirena IUD removed easily today after informed consent.  No complication and well-tolerated by patient.  IUD was complete and intact.  Postprocedure precautions given.  3. Encounter for initial prescription of contraceptive pills  Counseling on contraception.  Decision to start on the generic of Yasmin for contraception, cycle control and acne.  Usage, risks and benefits thoroughly reviewed.  Prescription sent to pharmacy.  Will start today.  Other orders - drospirenone-ethinyl estradiol (YASMIN) 3-0.03 MG tablet; Take 1 tablet by mouth daily.   Genia Del MD, 9:17 AM 01/19/2021

## 2021-01-25 ENCOUNTER — Ambulatory Visit: Payer: BC Managed Care – PPO | Admitting: Rehabilitative and Restorative Service Providers"

## 2021-02-18 ENCOUNTER — Other Ambulatory Visit: Payer: BC Managed Care – PPO | Admitting: Obstetrics & Gynecology

## 2021-02-18 ENCOUNTER — Other Ambulatory Visit: Payer: BC Managed Care – PPO

## 2021-02-24 ENCOUNTER — Ambulatory Visit: Payer: BC Managed Care – PPO | Admitting: Orthopedic Surgery

## 2021-03-10 ENCOUNTER — Ambulatory Visit: Payer: 59 | Admitting: Obstetrics & Gynecology

## 2021-07-13 ENCOUNTER — Ambulatory Visit: Payer: Self-pay | Admitting: Family Medicine

## 2021-08-25 ENCOUNTER — Ambulatory Visit: Payer: BC Managed Care – PPO | Admitting: Obstetrics & Gynecology

## 2021-08-30 ENCOUNTER — Other Ambulatory Visit: Payer: Self-pay | Admitting: Obstetrics & Gynecology

## 2021-08-30 NOTE — Telephone Encounter (Signed)
Last annual exam was 08/24/2020 No annual exam scheduled yet.

## 2021-10-10 ENCOUNTER — Other Ambulatory Visit: Payer: Self-pay | Admitting: Obstetrics & Gynecology

## 2021-10-26 NOTE — Progress Notes (Unsigned)
HPI:  [name] is a 31 y.o. female, who is here today with her husband for follow up. She was last seen on 01/12/21.  Since her last visit she has seen her gyn and ortho. She is requesting refills on OCP, started by her gyn.   OCP has helped with menstrual regularity and dysmenorrhea. She has tolerated medication well. She has tried Nexplanon and IUD, neither helped.  Headaches: Fronto parietal and temporal dull headaches have improved with OCP's. She stopped Topamax , she was having some occasional tingling sensation, resolved after discontinuing medication. She has a headache once per week, mild, exacerbated by skipping meals. No associated N/V,photophobia,or focal weakness.  Today she is c/o 4 days of left earache and drainage. Earache  has improved. No hx of trauma. No recent URI or travel. Muffle hearing. + Q tips, "black" cerumen. She used ear drops that have been prescribed before for pruritus, acetic acid-Hydrocortisone.  Negative for fever,sore throat, or nasal congestion.  She is also concerned about not able to lose wt. She has not been consistent with regular exercise, started walking 3 times per week for about 45 min and 4 months ago started decreasing carbs and sweets intake.  Review of Systems  Constitutional:  Negative for activity change and appetite change.  HENT:  Negative for mouth sores, nosebleeds, rhinorrhea and tinnitus.   Eyes:  Negative for redness.  Respiratory:  Negative for cough, shortness of breath and wheezing.   Cardiovascular:  Negative for chest pain, palpitations and leg swelling.  Gastrointestinal:  Negative for abdominal pain, nausea and vomiting.       Negative for changes in bowel habits.  Endocrine: Negative for cold intolerance, heat intolerance, polydipsia, polyphagia and polyuria.  Genitourinary:  Negative for decreased urine volume and hematuria.  Neurological:  Negative for syncope, facial asymmetry and numbness.  Rest  see pertinent positives and negatives per HPI.  Current Outpatient Medications on File Prior to Visit  Medication Sig Dispense Refill   acetaminophen (TYLENOL) 500 MG tablet Take by mouth.     acetic acid-hydrocortisone (VOSOL-HC) OTIC solution Place 3 drops into the left ear 2 (two) times daily as needed. 10 mL 0   No current facility-administered medications on file prior to visit.   Past Medical History:  Diagnosis Date   Anemia    Hx when younger   Headache, unspecified headache type 12/08/2020   History of UTI    In first pregnancy   Infection    History of intestinal infection   Pre-eclampsia    Pregnancy induced hypertension    2017-labor induced   No Known Allergies  Social History   Socioeconomic History   Marital status: Married    Spouse name: Sports coach   Number of children: 1   Years of education: 14   Highest education level: Not on file  Occupational History   Not on file  Tobacco Use   Smoking status: Never   Smokeless tobacco: Never  Vaping Use   Vaping Use: Never used  Substance and Sexual Activity   Alcohol use: Not Currently   Drug use: Never   Sexual activity: Yes    Partners: Male    Birth control/protection: I.U.D.  Other Topics Concern   Not on file  Social History Narrative   Not on file   Social Determinants of Health   Financial Resource Strain: Not on file  Food Insecurity: Food Insecurity Present (08/09/2019)   Hunger Vital Sign    Worried About Running  Out of Food in the Last Year: Sometimes true    Ran Out of Food in the Last Year: Sometimes true  Transportation Needs: No Transportation Needs (08/09/2019)   PRAPARE - Administrator, Civil Service (Medical): No    Lack of Transportation (Non-Medical): No  Physical Activity: Not on file  Stress: Not on file  Social Connections: Not on file   Vitals:   10/27/21 0910  BP: 126/80  Pulse: 86  Resp: 12  Temp: 98.1 F (36.7 C)  SpO2: 97%  Body mass index is 33.41  kg/m.  Physical Exam Vitals and nursing note reviewed.  Constitutional:      General: She is not in acute distress.    Appearance: She is well-developed and well-groomed.  HENT:     Head: Normocephalic and atraumatic.     Right Ear: Tympanic membrane, ear canal and external ear normal.     Left Ear: External ear normal. Drainage and tenderness present. No mastoid tenderness.     Ears:     Comments: Not able to see left TM, whitish thick drainage. Mild ear canal erythema, no edema.    Mouth/Throat:     Mouth: Mucous membranes are moist.     Pharynx: Oropharynx is clear.  Eyes:     Conjunctiva/sclera: Conjunctivae normal.  Cardiovascular:     Rate and Rhythm: Normal rate and regular rhythm.     Heart sounds: No murmur heard. Pulmonary:     Effort: Pulmonary effort is normal. No respiratory distress.     Breath sounds: Normal breath sounds.  Abdominal:     Palpations: Abdomen is soft. There is no hepatomegaly or mass.     Tenderness: There is no abdominal tenderness.  Lymphadenopathy:     Head:     Right side of head: No preauricular adenopathy.     Left side of head: No preauricular adenopathy.     Cervical: No cervical adenopathy.  Skin:    General: Skin is warm.     Findings: No erythema or rash.  Neurological:     General: No focal deficit present.     Mental Status: She is alert and oriented to person, place, and time.     Cranial Nerves: No cranial nerve deficit.     Gait: Gait normal.  Psychiatric:        Mood and Affect: Mood and affect normal.   ASSESSMENT AND PLAN: [name] was seen today for follow-up and medication refill.  Diagnoses and all orders for this visit:  Acute otitis externa of left ear, unspecified type Ciprodex 2-3 drops bid for 7-10 days. Avoid Q tips. Instructed about warning signs. F/U as needed.  -     ciprofloxacin-dexamethasone (CIPRODEX) OTIC suspension; Place 4 drops into the left ear 2 (two) times daily.  DUB (dysfunctional  uterine bleeding) Problem is well controlled with Syeda. We discussed some side effects. No changes today.  -     drospirenone-ethinyl estradiol (SYEDA) 3-0.03 MG tablet; Take 1 tablet by mouth daily.  Class 1 obesity due to excess calories with body mass index (BMI) of 33.0 to 33.9 in adult Patient understands the benefits of wt loss as well as adverse effects of obesity. Consistency with healthy diet and physical activity encouraged. Making positive life style changes, small steps at the time, will provide long lasting results with minimal risk for adverse effects; so I do not recommend pharmacologic treatment. She prefers to hold on referral to wt loss clinic.  Headache, unspecified headache type Improved with OCP. She is not longer on Topamax.  Return in about 1 year (around 10/28/2022) for cpe.  Kazimir Hartnett G. [place], MD  [facility]. [place] office.

## 2021-10-27 ENCOUNTER — Ambulatory Visit (INDEPENDENT_AMBULATORY_CARE_PROVIDER_SITE_OTHER): Payer: BC Managed Care – PPO | Admitting: Family Medicine

## 2021-10-27 ENCOUNTER — Encounter: Payer: Self-pay | Admitting: Family Medicine

## 2021-10-27 VITALS — BP 126/80 | HR 86 | Temp 98.1°F | Resp 12 | Ht 64.75 in | Wt 199.2 lb

## 2021-10-27 DIAGNOSIS — Z6833 Body mass index (BMI) 33.0-33.9, adult: Secondary | ICD-10-CM

## 2021-10-27 DIAGNOSIS — R519 Headache, unspecified: Secondary | ICD-10-CM

## 2021-10-27 DIAGNOSIS — H60502 Unspecified acute noninfective otitis externa, left ear: Secondary | ICD-10-CM | POA: Diagnosis not present

## 2021-10-27 DIAGNOSIS — E6609 Other obesity due to excess calories: Secondary | ICD-10-CM | POA: Diagnosis not present

## 2021-10-27 DIAGNOSIS — N938 Other specified abnormal uterine and vaginal bleeding: Secondary | ICD-10-CM

## 2021-10-27 MED ORDER — DROSPIRENONE-ETHINYL ESTRADIOL 3-0.03 MG PO TABS: 1.0000 | ORAL_TABLET | Freq: Every day | ORAL | 2 refills | Status: DC

## 2021-10-27 MED ORDER — CIPROFLOXACIN-DEXAMETHASONE 0.3-0.1 % OT SUSP: 4.0000 [drp] | Freq: Two times a day (BID) | OTIC | 0 refills | Status: DC

## 2021-10-27 NOTE — Patient Instructions (Addendum)
A few things to remember from today's visit:  DUB (dysfunctional uterine bleeding) - Plan: drospirenone-ethinyl estradiol (SYEDA) 3-0.03 MG tablet  Acute otitis externa of left ear, unspecified type - Plan: ciprofloxacin-dexamethasone (CIPRODEX) OTIC suspension  Headache, unspecified headache type  If you need refills for medications you take chronically, please call your pharmacy. Do not use My Chart to request refills or for acute issues that need immediate attention. If you send a my chart message, it may take a few days to be addressed, specially if I am not in the office.  Please be sure medication list is accurate. If a new problem present, please set up appointment sooner than planned today.  Otitis externa Otitis Externa  La otitis externa es una infeccin del canal auditivo externo. El canal auditivo externo es la zona que est entre el exterior de la oreja y el tmpano. A la otitis externa tambin se la llama odo de nadador. Cules son las causas? Las causas ms frecuentes de esta afeccin incluyen las siguientes: Nadar en agua sucia. Humedad en el odo. Una lesin en el interior del odo. Un objeto atascado en el odo. Un corte o rasguo en la parte exterior del odo o en el canal auditivo. Qu incrementa el riesgo? Es ms probable que tenga esta afeccin si nada con frecuencia. Cules son los signos o sntomas? Picazn en el odo. A menudo, este es Personnel officer. Hinchazn del odo. Enrojecimiento del odo. Dolor de odo. El dolor puede empeorar cuando se tira de la [place]. Secrecin de pus del odo. Cmo se trata? El tratamiento de esta afeccin puede incluir: Gotas ticas con antibitico. Generalmente se aplican durante 10 a 14 das. Medicamentos para reducir Higher education careers adviser y la hinchazn. Siga estas instrucciones en su casa: Si le recetaron gotas ticas con antibitico, selas como se lo haya indicado el mdico. No deje de usarlas aunque comience a sentirse  mejor. Use los medicamentos de venta libre y los recetados solamente como se lo haya indicado el mdico. Evite que le entre agua en los odos como se lo haya indicado el mdico. Es posible que le indiquen que no nade ni practique deportes de agua durante [employer]. Concurra a todas las visitas de seguimiento. Cmo se evita? Mantenga secos los odos. Use la punta de una toalla para secarse los odos despus de nadar o de darse un bao. Trate de no rascarse ni ponerse objetos en el odo. Estas acciones facilitan la proliferacin de microbios en el odo. Evite nadar en lagos, en agua sucia o en piscinas que puedan no tener la cantidad correcta de un producto qumico llamado cloro. Comunquese con un mdico si: Tiene fiebre. El odo sigue rojo, hinchado o le duele despus de [address]. An Scientist, research (medical) pus del odo despus de 3 das. El dolor, la hinchazn o el enrojecimiento empeoran. Tiene un dolor de cabeza muy intenso. Solicite ayuda de inmediato si: Tiene enrojecimiento, hinchazn, dolor o sensibilidad detrs de Museum/gallery conservator. Resumen La otitis externa es una infeccin del canal auditivo externo. Los sntomas son dolor, enrojecimiento e hinchazn del odo. Si le recetaron gotas ticas con antibitico, selas como se lo haya indicado el mdico. No deje de usarlas aunque comience a sentirse mejor. Trate de no rascarse ni ponerse objetos en el odo. Esta informacin no tiene Theme park manager el consejo del mdico. Asegrese de hacerle al mdico cualquier pregunta que tenga. Document Revised: 05/05/2020 Document Reviewed: 05/05/2020 Elsevier Patient Education  2023 [employer].  Recuento de  caloras para bajar de [employer] for [employer] Loss Las caloras son unidades de Engineer, drilling. El cuerpo necesita una cierta cantidad de caloras de los alimentos para que lo ayuden a funcionar durante todo Medical laboratory scientific officer. Cuando se comen o beben ms caloras de las que el cuerpo [place], este acumula las  caloras adicionales mayormente como grasa. Cuando se comen o beben menos caloras de las que el cuerpo [place], este quema grasa para obtener la energa que necesita. El recuento de caloras es el registro de la cantidad de caloras que se comen y Audiological scientist. El recuento de caloras puede ser de ayuda si necesita perder peso. Si come menos caloras de las que el cuerpo necesita, debera bajar de [place]. Pregntele al mdico cul es un peso sano para usted. Para que el recuento de caloras funcione, usted tendr que ingerir la cantidad de caloras adecuadas cada da, para bajar una cantidad de peso saludable por semana. Un nutricionista puede ayudar a determinar la cantidad de caloras que usted necesita por da y sugerirle formas de Barista su objetivo calrico. Neomia Dear cantidad de peso saludable para bajar cada semana suele ser entre 1 y 2 libras (0.5 a 0.9 kg). Esto habitualmente significa que su ingesta diaria de caloras se debera reducir en unas 500 a 750 caloras. Ingerir de 1200 a 1500 caloras por Clinical research associate a la [employer] de las mujeres a Publishing copy de [place]. Ingerir de 1500 a 1800 caloras por Clinical research associate a la [employer] de los hombres a Publishing copy de [place]. Qu debo saber acerca del recuento de caloras? Trabaje con el mdico o el nutricionista para determinar cuntas caloras debe recibir Management consultant. A fin de alcanzar su objetivo diario de caloras, tendr que: Averiguar cuntas caloras hay en cada alimento que le Lobbyist. Intente hacerlo antes de comer. Decidir la cantidad que puede comer del alimento. Llevar un registro de los alimentos. Para esto, anote lo que comi y cuntas caloras tena. Para perder peso con xito, es importante equilibrar el recuento de caloras con un estilo de vida saludable que incluya actividad fsica de forma regular. Dnde encuentro informacin sobre las caloras?  Es posible Veterinary surgeon cantidad de caloras que contiene un alimento en la etiqueta de  informacin nutricional. Si un alimento no tiene una etiqueta de informacin nutricional, intente buscar las caloras en Internet o pida ayuda al nutricionista. Recuerde que las caloras se calculan por porcin. Si opta por comer ms de una porcin de un alimento, tendr [employer] las caloras de una porcin por la cantidad de porciones que planea comer. Por ejemplo, la etiqueta de un envase de pan puede decir que el tamao de una porcin es 1 rodaja, y que una porcin tiene 90 caloras. Si come 1 rodaja, habr comido 90 caloras. Si come 2 rodajas, habr comido 180 caloras. Cmo llevo un registro de comidas? Despus de cada vez que coma, anote lo siguiente en el registro de alimentos lo antes posible: Lo que comi. Asegrese de [employer], las salsas y otros extras [employer]. La cantidad que comi. Esto se puede medir en tazas, onzas o cantidad de alimentos. Cuntas caloras haba en cada alimento y en cada bebida. La cantidad total de caloras en la comida que tom. Tenga a Scientific laboratory technician de alimentos, por ejemplo, en un anotador de bolsillo o utilice una aplicacin o sitio web en el telfono mvil. Algunos programas calcularn las caloras por usted y Insurance account manager la cantidad de caloras  que le quedan para llegar al objetivo diario. Cules son algunos consejos para controlar las porciones? Sepa cuntas caloras hay en una porcin. Esto lo ayudar a saber cuntas porciones de un alimento determinado puede comer. Use una taza medidora para medir los tamaos de las porciones. Tambin Hydrographic surveyor las porciones en una balanza de cocina. Con el tiempo, podr hacer un clculo estimativo de los tamaos de las porciones de algunos alimentos. Dedique tiempo a poner porciones de diferentes alimentos en sus platos, tazones y tazas predilectos, a fin de saber cmo se ve una porcin. Intente no comer directamente de un envase de alimentos, por ejemplo, de una bolsa o una caja.  Comer directamente del envase dificulta ver cunto est comiendo y puede conducir a Actuary. Ponga la cantidad [employer] gustara comer en una taza o un plato, a fin de asegurarse de que est comiendo la porcin correcta. Use platos, vasos y tazones ms pequeos para medir porciones ms pequeas y Automotive engineer no comer en exceso. Intente no realizar varias tareas al [employer]. Por ejemplo, evite mirar televisin o usar la [employer]urant come. Si es la hora de comer, sintese a Museum/gallery conservator y disfrute de Chemical engineer. Esto lo ayudar a Public house manager cundo est satisfecho. Tambin le permitir estar ms consciente de qu come y cunto come. Consejos para seguir Surveyor, minerals Al leer las etiquetas de los alimentos Controle el recuento de caloras en comparacin con el tamao de la porcin. El tamao de la porcin puede ser ms pequeo de lo que suele comer. Verifique la fuente de las caloras. Intente elegir alimentos ricos en protenas, fibras y vitaminas, y bajos en grasas saturadas, grasas trans y [employer]. Al ir de compras Lea las etiquetas nutricionales cuando compre. Esto lo ayudar a tomar decisiones saludables sobre qu alimentos comprar. Preste atencin a las etiquetas nutricionales de alimentos bajos en grasas o sin grasas. Estos alimentos a veces tienen la misma cantidad de caloras o ms caloras que las versiones ricas en grasas. Con frecuencia, tambin tienen agregados de azcar, almidn o sal, para darles el sabor que fue eliminado con las grasas. Haga una lista de compras con los alimentos que tienen un menor contenido de caloras y Leisure centre manager. Al cocinar Intente cocinar sus alimentos preferidos de una manera ms saludable. Por ejemplo, pruebe hornear en vez de frer. Utilice productos lcteos descremados. Planificacin de las comidas Utilice ms frutas y verduras. La mitad de su plato debe ser de frutas y verduras. Incluya protenas magras, como pollo, pavo y Fritch. Estilo de [employer],  trate de hacer una de las siguientes cosas: 150 minutos de ejercicio moderado, como caminar. 75 minutos de ejercicio enrgico, como correr. Informacin general Sepa cuntas caloras tienen los alimentos que come con ms frecuencia. Esto le ayudar a contar las caloras ms rpidamente. Encuentre un mtodo para controlar las caloras que funcione para usted. Sea creativo. Pruebe aplicaciones o programas distintos, si llevar un registro de las caloras no funciona para usted. Qu alimentos debo consumir?  Consuma alimentos nutritivos. Es mejor comer un alimento nutritivo, de alto contenido calrico, como un aguacate, que uno con pocos nutrientes, como una bolsa de patatas fritas. Use sus caloras en alimentos y bebidas que lo sacien y no lo dejen con apetito apenas termina de comer. Ejemplos de alimentos que lo sacian son los frutos secos y Civil engineer, contracting de frutos secos, verduras, Associate Professor y Forensic scientist con alto contenido de Research scientist (life sciences) como los cereales integrales. Los alimentos con FedEx  de Guyanafibra son aquellos que tienen ms de 5 g de fibra por porcin. Preste atencin a las Limited Brandscaloras en las bebidas. Las bebidas de bajas caloras incluyen agua y refrescos sin International aid/development workerazcar. Es posible que los productos que se enumeran ms [place]a no constituyan una lista completa de los alimentos y las bebidas que puede tomar. Consulte a un nutricionista para obtener ms informacin. Qu alimentos debo limitar? Limite el consumo de alimentos o bebidas que no sean buenas fuentes de vitaminas, minerales o protenas, o que tengan alto contenido de grasas no saludables. Estos incluyen: Caramelos. Otros dulces. Refrescos, bebidas con caf especiales, alcohol y Sloveniajugo. Es posible que los productos que se enumeran ms [place]riba no constituyan una lista completa de los alimentos y las bebidas que Personnel officerdebe evitar. Consulte a un nutricionista para obtener ms informacin. Cmo puedo hacer el recuento de caloras cuando como  afuera? Preste atencin a las porciones. A menudo, las porciones son mucho ms grandes al comer afuera. Pruebe con estos consejos para mantener las porciones ms pequeas: Considere la posibilidad de compartir una comida en lugar de tomarla toda usted solo. Si pide su propia comida, coma solo la mitad. Antes de empezar a comer, pida un recipiente y ponga la mitad de la comida en l. Cuando sea posible, considere la posibilidad de pedir porciones ms pequeas del men en lugar de porciones completas. Preste atencin a Soil scientistla eleccin de alimentos y bebidas. Saber la forma en que se cocinan los alimentos y lo que incluye la comida puede ayudarlo a ingerir menos caloras. Si se detallan las caloras en el men, elija las opciones que contengan la menor cantidad. Elija platos que incluyan verduras, frutas, cereales integrales, productos lcteos con bajo contenido de grasa y Associate Professorprotenas magras. Opte por los alimentos hervidos, asados, cocidos a la parrilla o al vapor. Evite los alimentos a los que se les ponga mantequilla, que estn empanados o fritos, o que se sirvan con salsa a base de crema. Generalmente, los alimentos que se etiquetan como "crujientes" estn fritos, a menos que se indique lo contrario. Elija el agua, la [place] Groveleche descremada, Oregonel t helado sin azcar u otras bebidas que no contengan azcares agregados. Si desea una bebida alcohlica, escoja una opcin con menos caloras, como una copa de vino o una cerveza ligera. Ordene los Pathmark Storesaderezos, las salsas y los jarabes aparte. Estos son, con frecuencia, de alto contenido en caloras, por lo que debe limitar la cantidad que ingiere. Si desea Canary Brimuna ensalada, elija una de hortalizas y pida carnes a la parrilla. Evite las guarniciones adicionales como el tocino, el queso o los alimentos fritos. Ordene el aderezo aparte o pida aceite de [place]iva y vinagre o limn para Emergency planning/management officeraderezar. Haga un clculo estimativo de la cantidad de porciones que le sirven. Conocer el tamao de  las porciones lo ayudar a Theme park managerestar atento a la cantidad de comida que come Pitney Bowesen los restaurantes. Dnde buscar ms informacin Centers for Disease Control and Prevention (Centros para el Control y la Prevencin de Enfermedades): FootballExhibition.com.brwww.cdc.gov U.S. Department of Agriculture (Departamento de Agricultura de los EE. UU.): WrestlingReporter.dkmyplate.gov Resumen El recuento de caloras es el registro de la cantidad de caloras que se comen y Audiological scientistbeben cada da. Si come menos caloras de las que el cuerpo necesita, debera bajar de [place]. Una cantidad de peso saludable para bajar por semana suele ser entre 1 y 2 libras (0.5 a 0.9 kg). Esto significa, con frecuencia, reducir su ingesta diaria de caloras unas 500 a 750 caloras. Es posible Clinical research associateencontrar  la cantidad de caloras que contiene un alimento en la etiqueta de informacin nutricional. Si un alimento no tiene una etiqueta de informacin nutricional, intente buscar las caloras en Internet o pida ayuda al nutricionista. Use platos, vasos y tazones ms pequeos para medir porciones ms pequeas y Automotive engineer no comer en exceso. Use sus caloras en alimentos y bebidas que lo sacien y no lo dejen con apetito poco tiempo despus de haber comido. Esta informacin no tiene Theme park manager el consejo del mdico. Asegrese de hacerle al mdico cualquier pregunta que tenga. Document Revised: 05/27/2019 Document Reviewed: 05/27/2019 Elsevier Patient Education  2023 [employer].

## 2021-10-27 NOTE — Assessment & Plan Note (Addendum)
Tension like headache. She has tried Amitriptyline and Topamax with some improvement. Improved with OCP. Instructed about warning signs. F/U as needed.

## 2021-10-27 NOTE — Assessment & Plan Note (Addendum)
She understands the benefits of wt loss as well as adverse effects of obesity. Consistency with healthy diet and physical activity encouraged. Making positive life style changes, small steps at the time, will provide long lasting results with minimal risk for adverse effects; so I do not recommend pharmacologic treatment. She prefers to hold on referral to wt loss clinic.

## 2022-07-27 ENCOUNTER — Ambulatory Visit (INDEPENDENT_AMBULATORY_CARE_PROVIDER_SITE_OTHER): Payer: BC Managed Care – PPO | Admitting: Internal Medicine

## 2022-07-27 ENCOUNTER — Encounter: Payer: Self-pay | Admitting: Internal Medicine

## 2022-07-27 VITALS — BP 120/80 | HR 99 | Temp 97.7°F | Wt 210.2 lb

## 2022-07-27 DIAGNOSIS — H6062 Unspecified chronic otitis externa, left ear: Secondary | ICD-10-CM | POA: Diagnosis not present

## 2022-07-27 DIAGNOSIS — Z6835 Body mass index (BMI) 35.0-35.9, adult: Secondary | ICD-10-CM

## 2022-07-27 DIAGNOSIS — E669 Obesity, unspecified: Secondary | ICD-10-CM

## 2022-07-27 NOTE — Progress Notes (Signed)
     Established Patient Office Visit     CC/Reason for Visit: Decreased hearing left ear  HPI: Barbara Noble is a 32 y.o. female who is coming in today for the above mentioned reasons.  For the past year she has had issues with her left ear.  She has noticed decreased hearing, and a sensation of pressure.  She has been prescribed Cipro drops as well as amoxicillin without relief.  She would also like to discuss weight loss and is interested in GLP-1's.   Past Medical/Surgical History: Past Medical History:  Diagnosis Date   Anemia    Hx when younger   Headache, unspecified headache type 12/08/2020   History of UTI    In first pregnancy   Infection    History of intestinal infection   Pre-eclampsia    Pregnancy induced hypertension    2017-labor induced    Past Surgical History:  Procedure Laterality Date   APPENDECTOMY     INTRAUTERINE DEVICE (IUD) INSERTION     mirena inserted 09-03-20   OVARIAN CYST REMOVAL      Social History:  reports that she has never smoked. She has never used smokeless tobacco. She reports that she does not currently use alcohol. She reports that she does not use drugs.  Allergies: No Known Allergies  Family History:  Family History  Problem Relation Age of Onset   Diabetes Mother    Hypertension Mother    Healthy Father    Hypertension Maternal Grandmother    Diabetes Maternal Grandmother      Current Outpatient Medications:    acetaminophen (TYLENOL) 500 MG tablet, Take by mouth., Disp: , Rfl:    acetic acid-hydrocortisone (VOSOL-HC) OTIC solution, Place 3 drops into the left ear 2 (two) times daily as needed., Disp: 10 mL, Rfl: 0   ciprofloxacin-dexamethasone (CIPRODEX) OTIC suspension, Place 4 drops into the left ear 2 (two) times daily., Disp: 7.5 mL, Rfl: 0   drospirenone-ethinyl estradiol (SYEDA) 3-0.03 MG tablet, Take 1 tablet by mouth daily., Disp: 84 tablet, Rfl: 2  Review of Systems:  Negative unless indicated in  HPI.   Physical Exam: Vitals:   07/27/22 1456  BP: 120/80  Pulse: 99  Temp: 97.7 F (36.5 C)  TempSrc: Oral  SpO2: 100%  Weight: 210 lb 3.2 oz (95.3 kg)    Body mass index is 35.25 kg/m.   Physical Exam HENT:     Right Ear: Hearing, tympanic membrane, ear canal and external ear normal.     Left Ear: Decreased hearing noted. A middle ear effusion is present.      Impression and Plan:  Chronic otitis externa of left ear, unspecified type -     Ambulatory referral to ENT  Obesity (BMI 35.0-39.9 without comorbidity)   -Discussed healthy lifestyle, including increased physical activity and better food choices to promote weight loss.  She will call her insurance to see if they have a prescription drug weight loss benefit for GLP-1's.  If so she will reach out back to PCP for consideration of prescription. 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  Since she has hearing loss I think she needs to see ENT for further evaluation.  Time spent:23 minutes reviewing chart, interviewing and examining patient and formulating plan of care.     Chaya Jan, MD [facility]

## 2022-08-12 DIAGNOSIS — B369 Superficial mycosis, unspecified: Secondary | ICD-10-CM | POA: Diagnosis not present

## 2022-08-12 DIAGNOSIS — H6240 Otitis externa in other diseases classified elsewhere, unspecified ear: Secondary | ICD-10-CM | POA: Diagnosis not present

## 2022-08-26 ENCOUNTER — Other Ambulatory Visit: Payer: Self-pay | Admitting: Family Medicine

## 2022-08-26 DIAGNOSIS — N938 Other specified abnormal uterine and vaginal bleeding: Secondary | ICD-10-CM

## 2022-09-02 ENCOUNTER — Telehealth: Payer: Self-pay | Admitting: Family Medicine

## 2022-09-02 NOTE — Telephone Encounter (Signed)
Pt called to say she received a bill for $161.13 for her 07/27/22 visit for ear pain.  Dr. Swaziland had no availability, so Pt saw Dr. Ardyth Harps .  Pt has BC/BS and Dr. Swaziland is her Primary Care Physician.  Pt is asking for assistance resolving this bill.   Please advise.   [phone]

## 2022-09-06 NOTE — Telephone Encounter (Signed)
Pt called to follow up on this inquiry, stating that she is very worried that this bill will be sent to collections, before she gets a response.  Pt is asking for any assistance possible, to get this bill resolved.   PS:  Pt did try calling the Billing Dept, but there was no one available to translate for her.  Please advise.

## 2022-09-08 NOTE — Telephone Encounter (Signed)
Pt was called and informed of findings. Pt verbalized understanding.

## 2022-11-04 DIAGNOSIS — R059 Cough, unspecified: Secondary | ICD-10-CM | POA: Diagnosis not present

## 2022-11-04 DIAGNOSIS — Z20822 Contact with and (suspected) exposure to covid-19: Secondary | ICD-10-CM | POA: Diagnosis not present

## 2022-11-04 DIAGNOSIS — J22 Unspecified acute lower respiratory infection: Secondary | ICD-10-CM | POA: Diagnosis not present

## 2022-11-04 DIAGNOSIS — Z3202 Encounter for pregnancy test, result negative: Secondary | ICD-10-CM | POA: Diagnosis not present

## 2022-11-14 DIAGNOSIS — J40 Bronchitis, not specified as acute or chronic: Secondary | ICD-10-CM | POA: Diagnosis not present

## 2022-11-14 DIAGNOSIS — Z3202 Encounter for pregnancy test, result negative: Secondary | ICD-10-CM | POA: Diagnosis not present

## 2022-11-14 DIAGNOSIS — Z20822 Contact with and (suspected) exposure to covid-19: Secondary | ICD-10-CM | POA: Diagnosis not present

## 2022-11-25 ENCOUNTER — Ambulatory Visit: Payer: BC Managed Care – PPO | Admitting: Family Medicine

## 2022-11-25 ENCOUNTER — Encounter: Payer: Self-pay | Admitting: Family Medicine

## 2022-11-25 VITALS — BP 120/80 | HR 100 | Resp 12 | Ht 64.75 in | Wt 200.4 lb

## 2022-11-25 DIAGNOSIS — R059 Cough, unspecified: Secondary | ICD-10-CM | POA: Diagnosis not present

## 2022-11-25 DIAGNOSIS — H1013 Acute atopic conjunctivitis, bilateral: Secondary | ICD-10-CM

## 2022-11-25 DIAGNOSIS — J4541 Moderate persistent asthma with (acute) exacerbation: Secondary | ICD-10-CM

## 2022-11-25 DIAGNOSIS — K219 Gastro-esophageal reflux disease without esophagitis: Secondary | ICD-10-CM | POA: Diagnosis not present

## 2022-11-25 MED ORDER — OMEPRAZOLE 40 MG PO CPDR: 40.0000 mg | DELAYED_RELEASE_CAPSULE | Freq: Every day | ORAL | 1 refills | Status: DC

## 2022-11-25 MED ORDER — PREDNISONE 20 MG PO TABS: ORAL_TABLET | ORAL | 0 refills | Status: AC

## 2022-11-25 MED ORDER — BUDESONIDE-FORMOTEROL FUMARATE 80-4.5 MCG/ACT IN AERO: 2.0000 | INHALATION_SPRAY | Freq: Two times a day (BID) | RESPIRATORY_TRACT | 3 refills | Status: DC

## 2022-11-25 MED ORDER — OLOPATADINE HCL 0.1 % OP SOLN: 1.0000 [drp] | Freq: Two times a day (BID) | OPHTHALMIC | 1 refills | Status: DC

## 2022-11-25 NOTE — Progress Notes (Signed)
ACUTE VISIT Chief Complaint  Patient presents with   Cough   HPI: Barbara Noble is a 32 y.o. female with a PMHx significant for headaches and chronic back pain here today for cough.  She complains of a constant cough for at least a month.  Associated DOE,wheezing,and chest discomfort. Sx's exacerbated with exercise.  Cough The current episode started 1 to 4 weeks ago. The problem has been unchanged. The cough is Productive of sputum. Associated symptoms include headaches, heartburn, postnasal drip, rhinorrhea, shortness of breath and wheezing. Pertinent negatives include no chest pain, chills, ear congestion, ear pain, fever, hemoptysis, myalgias, nasal congestion, rash, sore throat, sweats or weight loss. The symptoms are aggravated by exercise. Risk factors for lung disease include animal exposure. She has tried oral steroids and a beta-agonist inhaler for the symptoms. There is no history of asthma or environmental allergies.  Productive cough with clear sputum.  She endorses frontal pressure headache, clear rhinorrhea, pruritic eyes, conjunctival erythema, and occasional heartburn.  Acid reflux with coughing spells. Cough is also interfering with sleep.  She has been seen in urgent care two times for these problems, but does not have the dates.   On one of the visits, she says her SpO2 was 86%. She was given albuterol inh, prednisone for 4 days, and doxycycline for 7 days. She also says her lungs were "inflamed" on chest x-ray.  She was given Covid tests both times and they were negative.   She has no history of asthma.  She has never smoked.  She mentioned she adopted a cat 1-2 months ago.  Inquiring about allergy testing.  She is not currently taking any medication for her heartburn.  Review of Systems  Constitutional:  Positive for activity change and fatigue. Negative for appetite change, chills, fever and weight loss.  HENT:  Positive for postnasal drip and  rhinorrhea. Negative for ear pain and sore throat.   Respiratory:  Positive for cough, shortness of breath and wheezing. Negative for hemoptysis.   Cardiovascular:  Negative for chest pain.  Gastrointestinal:  Positive for heartburn. Negative for abdominal pain, nausea and vomiting.  Genitourinary:  Negative for decreased urine volume and hematuria.  Musculoskeletal:  Negative for myalgias.  Skin:  Negative for rash.  Allergic/Immunologic: Negative for environmental allergies.  Neurological:  Positive for headaches. Negative for syncope.  Psychiatric/Behavioral:  Negative for confusion. The patient is nervous/anxious.   See other pertinent positives and negatives in HPI.  Current Outpatient Medications on File Prior to Visit  Medication Sig Dispense Refill   acetaminophen (TYLENOL) 500 MG tablet Take by mouth.     SYEDA 3-0.03 MG tablet TAKE 1 TABLET BY MOUTH EVERY DAY 28 tablet 8   No current facility-administered medications on file prior to visit.   Past Medical History:  Diagnosis Date   Anemia    Hx when younger   Headache, unspecified headache type 12/08/2020   History of UTI    In first pregnancy   Infection    History of intestinal infection   Pre-eclampsia    Pregnancy induced hypertension    2017-labor induced   No Known Allergies  Social History   Socioeconomic History   Marital status: Married    Spouse name: Sports coach   Number of children: 1   Years of education: 14   Highest education level: Professional school degree (e.g., MD, DDS, DVM, JD)  Occupational History   Not on file  Tobacco Use   Smoking status: Never  Smokeless tobacco: Never  Vaping Use   Vaping status: Never Used  Substance and Sexual Activity   Alcohol use: Not Currently   Drug use: Never   Sexual activity: Yes    Partners: Male    Birth control/protection: I.U.D.  Other Topics Concern   Not on file  Social History Narrative   Not on file   Social Determinants of Health    Financial Resource Strain: Low Risk  (11/24/2022)   Overall Financial Resource Strain (CARDIA)    Difficulty of Paying Living Expenses: Not hard at all  Food Insecurity: No Food Insecurity (11/24/2022)   Hunger Vital Sign    Worried About Running Out of Food in the Last Year: Never true    Ran Out of Food in the Last Year: Never true  Transportation Needs: No Transportation Needs (11/24/2022)   PRAPARE - Administrator, Civil Service (Medical): No    Lack of Transportation (Non-Medical): No  Physical Activity: Insufficiently Active (11/24/2022)   Exercise Vital Sign    Days of Exercise per Week: 3 days    Minutes of Exercise per Session: 30 min  Stress: Stress Concern Present (11/24/2022)   [employer] of Occupational Health - Occupational Stress Questionnaire    Feeling of Stress : To some extent  Social Connections: Moderately Integrated (11/24/2022)   Social Connection and Isolation Panel [NHANES]    Frequency of Communication with Friends and Family: More than three times a week    Frequency of Social Gatherings with Friends and Family: Once a week    Attends Religious Services: More than 4 times per year    Active Member of [employer] or Organizations: No    Attends Banker Meetings: Not on file    Marital Status: Married   Vitals:   11/25/22 1129  BP: 120/80  Pulse: 100  Resp: 12  SpO2: 96%   Body mass index is 33.6 kg/m.  Physical Exam Vitals and nursing note reviewed.  Constitutional:      General: She is not in acute distress.    Appearance: She is well-developed.  HENT:     Head: Normocephalic and atraumatic.     Right Ear: Tympanic membrane, ear canal and external ear normal.     Left Ear: Tympanic membrane, ear canal and external ear normal.     Mouth/Throat:     Mouth: Mucous membranes are moist.     Pharynx: Oropharynx is clear.  Eyes:     General:        Right eye: No discharge.        Left eye: No discharge.      Conjunctiva/sclera: Conjunctivae normal.     Right eye: Right conjunctiva is not injected.     Left eye: Left conjunctiva is not injected.     Pupils: Pupils are equal, round, and reactive to light.  Cardiovascular:     Rate and Rhythm: Normal rate and regular rhythm.     Heart sounds: No murmur heard. Pulmonary:     Effort: Pulmonary effort is normal. No respiratory distress.     Breath sounds: No decreased air movement. Wheezing present. No rhonchi or rales.  Lymphadenopathy:     Cervical: No cervical adenopathy.  Skin:    General: Skin is warm.     Findings: No erythema or rash.  Neurological:     General: No focal deficit present.     Mental Status: She is alert and oriented to person, place, and  time.     Cranial Nerves: No cranial nerve deficit.     Gait: Gait normal.  Psychiatric:        Mood and Affect: Mood and affect normal.   ASSESSMENT AND PLAN:  Ms. Sklar was seen today for cough.   Moderate persistent reactive airway disease with wheezing with acute exacerbation No prior Hx. Around the time she adopted a kitty cat she started with symptoms. No known cat hair allergies. Recommend continuing albuterol inhaler 1 to 2 puff every 4-6 hours as needed. Symbicort 80-4.5 mcg 2 puffs twice daily added today. We discussed some side effects, instructed to swish x 2 after Symbicort use. She agrees with Prednisone taper, side effects discussed, instructed to take it with breakfast. Immunology referral placed. Follow-up in 4 weeks, before if needed.  -     Budesonide-Formoterol Fumarate; Inhale 2 puffs into the lungs 2 (two) times daily.  Dispense: 1 each; Refill: 3 -     Ambulatory referral to Immunology -     predniSONE; 2 tabs for 5 days, 1 tabs for 3 days, and 1/2 tab for 3 days. Take tables together with breakfast.  Dispense: 15 tablet; Refill: 0  Gastroesophageal reflux disease, unspecified whether esophagitis present Reported as mild. Can be contributing to or  aggravated by cough. She agrees with Omeprazole 40 mg 30 min before breakfast x 4-6 weeks. GERD precautions calves recommended.  -     Omeprazole; Take 1 capsule (40 mg total) by mouth daily.  Dispense: 30 capsule; Refill: 1  Cough, unspecified type We discussed possible etiologies, including allergies, GERD, and asthma. She reports having a chest x-ray in one of the ED visits,I cannot see visits or imaging on EPIC.  I do not think imaging is needed at this time.  Allergic conjunctivitis of both eyes Symptoms are not present today. Symptomatic treatment with topical treatment recommended. Monitor for new symptoms. -     Olopatadine HCl; Place 1 drop into both eyes 2 (two) times daily.  Dispense: 5 mL; Refill: 1  Return in about 4 weeks (around 12/23/2022).  I, Rolla Etienne Wierda, acting as a scribe for Navi Erber Sw[place], MD., have documented all relevant documentation on the behalf of Vona Whiters [place], MD, as directed by  Shelley Cocke [place], MD while in the presence of Gwendalynn Eckstrom [place], MD.   I, Kindsey Eblin [place], MD, have reviewed all documentation for this visit. The documentation on 11/25/22 for the exam, diagnosis, procedures, and orders are all accurate and complete.  Kehaulani Fruin G. [place], MD  [facility]. [place] office.

## 2022-11-25 NOTE — Patient Instructions (Addendum)
A few things to remember from today's visit:  Gastroesophageal reflux disease, unspecified whether esophagitis present - Plan: omeprazole (PRILOSEC) 40 MG capsule  Cough, unspecified type  Moderate persistent reactive airway disease with wheezing with acute exacerbation - Plan: budesonide-formoterol (SYMBICORT) 80-4.5 MCG/ACT inhaler, Ambulatory referral to Immunology, predniSONE (DELTASONE) 20 MG tablet  Allergic conjunctivitis of both eyes - Plan: olopatadine (PATANOL) 0.1 % ophthalmic solution  Symbicort 2 veces al dia, haga garbaras 2 veces despues de cada uso.

## 2023-01-04 ENCOUNTER — Ambulatory Visit (INDEPENDENT_AMBULATORY_CARE_PROVIDER_SITE_OTHER): Payer: BC Managed Care – PPO | Admitting: Allergy

## 2023-01-04 ENCOUNTER — Encounter: Payer: Self-pay | Admitting: Allergy

## 2023-01-04 VITALS — BP 124/84 | HR 110 | Temp 98.0°F | Resp 16 | Ht 65.0 in | Wt 194.0 lb

## 2023-01-04 DIAGNOSIS — J302 Other seasonal allergic rhinitis: Secondary | ICD-10-CM | POA: Diagnosis not present

## 2023-01-04 DIAGNOSIS — H1013 Acute atopic conjunctivitis, bilateral: Secondary | ICD-10-CM

## 2023-01-04 DIAGNOSIS — J45998 Other asthma: Secondary | ICD-10-CM | POA: Diagnosis not present

## 2023-01-04 DIAGNOSIS — J3089 Other allergic rhinitis: Secondary | ICD-10-CM

## 2023-01-04 DIAGNOSIS — J4540 Moderate persistent asthma, uncomplicated: Secondary | ICD-10-CM | POA: Diagnosis not present

## 2023-01-04 MED ORDER — LEVOCETIRIZINE DIHYDROCHLORIDE 5 MG PO TABS: 5.0000 mg | ORAL_TABLET | Freq: Every evening | ORAL | 5 refills | Status: AC

## 2023-01-04 MED ORDER — OLOPATADINE HCL 0.2 % OP SOLN: 1.0000 [drp] | Freq: Every day | OPHTHALMIC | 4 refills | Status: AC | PRN

## 2023-01-04 MED ORDER — ALBUTEROL SULFATE HFA 108 (90 BASE) MCG/ACT IN AERS: 2.0000 | INHALATION_SPRAY | Freq: Four times a day (QID) | RESPIRATORY_TRACT | 1 refills | Status: AC | PRN

## 2023-01-04 MED ORDER — BUDESONIDE-FORMOTEROL FUMARATE 160-4.5 MCG/ACT IN AERO: 2.0000 | INHALATION_SPRAY | Freq: Two times a day (BID) | RESPIRATORY_TRACT | 5 refills | Status: AC

## 2023-01-04 MED ORDER — AZELASTINE HCL 0.1 % NA SOLN: 2.0000 | Freq: Two times a day (BID) | NASAL | 5 refills | Status: AC

## 2023-01-04 NOTE — Progress Notes (Signed)
New Patient Note  RE: Barbara Noble MRN: [id number] DOB: 10-01-1990 Date of Office Visit: 01/04/2023  Primary care provider: [place], Betty G, MD  Chief Complaint: asthma  History of present illness: Barbara Noble is a 32 y.o. female presenting today for evaluation of asthma.  She presents today with her husband.  Spanish interpreter present for translation today.   Discussed the use of AI scribe software for clinical note transcription with the patient, who gave verbal consent to proceed.  The patient was referred for evaluation of suspected asthma and allergies. She reports a recent onset of respiratory symptoms, including shortness of breath, coughing, and wheezing. These symptoms were reportedly worse at night and occasionally severe enough to prevent sleep. The patient also experienced episodes of chest tightness, described as feeling suffocated.  She has no history of asthma respiratory issues as a child.    The patient's symptoms were initially managed with inhalers, including Symbicort and Albuterol. However, the patient reported only temporary relief while on a course of oral steroids. Despite using the inhalers as directed, the patient had to seek urgent care approximately three weeks prior due to the severity of her symptoms.  She also states while she was using the Symbicort she was still needing to use the albuterol at night most nights.  The patient's living environment was recently modified due to suspected triggers. She had a cat in the house and reported issues with humidity or insulation, which led to the development of mold. After removing the cat and improving the insulation in the house, the patient reported improvement in her symptoms.  She even states that the cat has been gone over the past week she is not really had any symptoms and has not used the Symbicort.  In addition to respiratory symptoms, the patient also reported occasional allergic symptoms such as  sneezing, itchy and watery eyes, and a runny or stuffy nose. These symptoms were not regularly managed with any allergy medications, although she had tried Zyrtec a couple of times without significant relief     Review of systems: 10pt ROS negative unless noted above in HPI  All other systems negative unless noted above in HPI  Past medical history: Past Medical History:  Diagnosis Date   Anemia    Hx when younger   Angio-edema    Headache, unspecified headache type 12/08/2020   History of UTI    In first pregnancy   Infection    History of intestinal infection   Pre-eclampsia    Pregnancy induced hypertension    2017-labor induced    Past surgical history: Past Surgical History:  Procedure Laterality Date   APPENDECTOMY     INTRAUTERINE DEVICE (IUD) INSERTION     mirena inserted 09-03-20   OVARIAN CYST REMOVAL      Family history:  Family History  Problem Relation Age of Onset   Asthma Mother    Diabetes Mother    Hypertension Mother    Healthy Father    Asthma Brother    Hypertension Maternal Grandmother    Diabetes Maternal Grandmother    Allergic rhinitis Neg Hx    Eczema Neg Hx    Urticaria Neg Hx     Social history: Lives in a home without carpeting with gas heating and central/ceiling fan cooling. Cat in the home.  No pets outside the home.  There is concern for water damage or mildew in the home but no concerns for roaches in the home.  She  is a housewife.  Denies a smoking history.  Medication List: Current Outpatient Medications  Medication Sig Dispense Refill   acetaminophen (TYLENOL) 500 MG tablet Take by mouth.     albuterol (VENTOLIN HFA) 108 (90 Base) MCG/ACT inhaler Inhale 2 puffs into the lungs every 6 (six) hours as needed for wheezing or shortness of breath (albuterol 2 puffs 10-15 minutes before physical activity. - Rescue medications: albuterol 2 puffs every 4-6 hours as needed). 8 g 1   azelastine (ASTELIN) 0.1 % nasal spray Place 2 sprays  into both nostrils 2 (two) times daily. Use in each nostril as directed 30 mL 5   budesonide-formoterol (SYMBICORT) 160-4.5 MCG/ACT inhaler Inhale 2 puffs into the lungs 2 (two) times daily. 1 each 5   Cholecalciferol (VITAMIN D-3 PO) Take 2 tablets by mouth daily.     drospirenone-ethinyl estradiol (SYEDA) 3-0.03 MG tablet Take 1 tablet by mouth daily.     levocetirizine (XYZAL) 5 MG tablet Take 1 tablet (5 mg total) by mouth every evening. 30 tablet 5   Olopatadine HCl 0.2 % SOLN Apply 1 drop to eye daily as needed. 2.5 mL 4   Probiotic Product (PROBIOTIC DAILY PO) Take 2 tablets by mouth daily.     No current facility-administered medications for this visit.    Known medication allergies: No Known Allergies   Physical examination: Blood pressure 124/84, pulse (!) 110, temperature 98 F (36.7 C), temperature source Temporal, resp. rate 16, height 5\' 5"  (1.651 m), weight 194 lb (88 kg), SpO2 96%.  General: Alert, interactive, in no acute distress. HEENT: PERRLA, TMs pearly gray, turbinates non-edematous without discharge, post-pharynx non erythematous. Neck: Supple without lymphadenopathy. Lungs: Clear to auscultation without wheezing, rhonchi or rales. {no increased work of breathing. CV: Normal S1, S2 without murmurs. Abdomen: Nondistended, nontender. Skin: Warm and dry, without lesions or rashes. Extremities:  No clubbing, cyanosis or edema. Neuro:   Grossly intact.  Diagnositics/Labs:  Spirometry: FEV1: 2.49L 82%, FVC: 2.86L 80%, ratio consistent with nonobstructive pattern  Allergy testing:   Airborne Adult Perc - 01/04/23 1454     Time Antigen Placed 0255    Allergen Manufacturer Waynette Buttery    Location Back    Number of Test 55    Panel 1 Select    1. Control-Buffer 50% Glycerol Negative    2. Control-Histamine 2+    3. Bahia Negative    4. French Southern Territories Negative    5. Johnson Negative    6. Kentucky Blue Negative    7. Meadow Fescue Negative    8. Perennial Rye Negative     9. Timothy 2+    10. Ragweed Mix Negative    11. Cocklebur Negative    12. Plantain,  English Negative    13. Baccharis Negative    14. Dog Fennel Negative    15. Russian Thistle Negative    16. Lamb's Quarters Negative    17. Sheep Sorrell Negative    18. Rough Pigweed 2+    19. Marsh Elder, Rough 2+    20. Mugwort, Common 2+    21. Box, Elder Negative    22. Cedar, red Negative    23. Sweet Gum Negative    24. Pecan Pollen 2+    25. Pine Mix Negative    26. Walnut, Black Pollen Negative    27. Red Mulberry Negative    28. Ash Mix Negative    29. Birch Mix 2+    30. Beech American 2+    31.  Cottonwood, Guinea-Bissau Negative    32. Hickory, White 2+    33. Maple Mix Negative    34. Oak, Guinea-Bissau Mix Negative    35. Sycamore Eastern Negative    36. Alternaria Alternata Negative    37. Cladosporium Herbarum Negative    38. Aspergillus Mix Negative    39. Penicillium Mix Negative    40. Bipolaris Sorokiniana (Helminthosporium) 2+    41. Drechslera Spicifera (Curvularia) Negative    42. Mucor Plumbeus 2+    43. Fusarium Moniliforme Negative    44. Aureobasidium Pullulans (pullulara) 2+    45. Rhizopus Oryzae Negative    46. Botrytis Cinera Negative    47. Epicoccum Nigrum 2+    48. Phoma Betae 2+    49. Dust Mite Mix Negative    50. Cat Hair 10,000 BAU/ml 2+    51.  Dog Epithelia Negative    52. Mixed Feathers Negative    53. Horse Epithelia 2+    54. Cockroach, German 3+    55. Tobacco Leaf Negative             Allergy testing results were read and interpreted by provider, documented by clinical staff.   Assessment and plan:   Asthma Recent onset of symptoms including shortness of breath, coughing, and wheezing, particularly at night. Symptoms improved with steroid treatment.. Known triggers include cat allergens. -Perform lung function test today to assess severity of asthma-  this is normal today! - Daily controller medication(s): none - Prior to physical  activity if needed: albuterol 2 puffs 10-15 minutes before physical activity. - Rescue medications: albuterol 2 puffs every 4-6 hours as needed - Changes during respiratory infections (if sick with a cold) or worsening symptoms or known exposures to triggers:  Symbicort 160mg   2   twice daily for TWO WEEKS or until symptoms have resolved. - Asthma control goals:  * Full participation in all desired activities (may need albuterol before activity) * Albuterol use two time or less a week on average (not counting use with activity) * Cough interfering with sleep two time or less a month * Oral steroids no more than once a year * No hospitalizations   Allergic Rhinitis with conjunctivitis Reports of occasional sneezing, itchy and runny nose, and itchy eyes. Has tried Zyrtec with limited effect. - Testing today showed: grasses, weeds, trees, outdoor molds, cat, cockroach, and horse - Copy of test results provided.  - Avoidance measures provided. - Start taking: Xyzal (levocetirizine) 5mg  tablet once daily as needed. Astepro 2 sprays each nostril twice a day as needed for runny nose.  Pataday 1 drop each eye daily as needed for itchy/water eyes. - You can use an extra dose of the antihistamine, if needed, for breakthrough symptoms.  - Consider nasal saline rinses 1-2 times daily to remove allergens from the nasal cavities as well as help with mucous clearance (this is especially helpful to do before the nasal sprays are given) - Consider allergy shots as a means of long-term control. - Allergy shots "re-train" and "reset" the immune system to ignore environmental allergens and decrease the resulting immune response to those allergens (sneezing, itchy watery eyes, runny nose, nasal congestion, etc).    - Allergy shots improve symptoms in 75-85% of patients.  - We can discuss more at a future appointment if the medications are not working for you.  Follow-up in 4-6 months or sooner if needed  I  appreciate the opportunity to take part in [place] care. Please  do not hesitate to contact me with questions.  Sincerely,   Margo Aye, MD Allergy/Immunology [facility]

## 2023-01-04 NOTE — Patient Instructions (Addendum)
Asthma Recent onset of symptoms including shortness of breath, coughing, and wheezing, particularly at night. Symptoms improved with steroid treatment.. Known triggers include cat allergens. -Perform lung function test today to assess severity of asthma-  this is normal today! - Daily controller medication(s): none - Prior to physical activity if needed: albuterol 2 puffs 10-15 minutes before physical activity. - Rescue medications: albuterol 2 puffs every 4-6 hours as needed - Changes during respiratory infections (if sick with a cold) or worsening symptoms or known exposures to triggers:  Symbicort 160mg   2   twice daily for TWO WEEKS or until symptoms have resolved. - Asthma control goals:  * Full participation in all desired activities (may need albuterol before activity) * Albuterol use two time or less a week on average (not counting use with activity) * Cough interfering with sleep two time or less a month * Oral steroids no more than once a year * No hospitalizations   Allergic Rhinitis Reports of occasional sneezing, itchy and runny nose, and itchy eyes. Has tried Zyrtec with limited effect. - Testing today showed: grasses, weeds, trees, outdoor molds, cat, cockroach, and horse - Copy of test results provided.  - Avoidance measures provided. - Start taking: Xyzal (levocetirizine) 5mg  tablet once daily as needed. Astepro 2 sprays each nostril twice a day as needed for runny nose.  Pataday 1 drop each eye daily as needed for itchy/water eyes. - You can use an extra dose of the antihistamine, if needed, for breakthrough symptoms.  - Consider nasal saline rinses 1-2 times daily to remove allergens from the nasal cavities as well as help with mucous clearance (this is especially helpful to do before the nasal sprays are given) - Consider allergy shots as a means of long-term control. - Allergy shots "re-train" and "reset" the immune system to ignore environmental allergens and decrease  the resulting immune response to those allergens (sneezing, itchy watery eyes, runny nose, nasal congestion, etc).    - Allergy shots improve symptoms in 75-85% of patients.  - We can discuss more at a future appointment if the medications are not working for you.

## 2023-06-28 ENCOUNTER — Other Ambulatory Visit: Payer: Self-pay | Admitting: Family Medicine

## 2023-06-28 DIAGNOSIS — N938 Other specified abnormal uterine and vaginal bleeding: Secondary | ICD-10-CM

## 2023-06-28 NOTE — Telephone Encounter (Signed)
This is a historical medication.  Okay to refill?

## 2023-07-05 ENCOUNTER — Ambulatory Visit: Payer: BC Managed Care – PPO | Admitting: Allergy

## 2023-07-05 DIAGNOSIS — J309 Allergic rhinitis, unspecified: Secondary | ICD-10-CM
